# Patient Record
Sex: Female | Born: 1950 | Race: Black or African American | Hispanic: No | Marital: Married
Health system: Southern US, Community
[De-identification: ages and names within clinical notes are randomized; demographics above are authoritative.]

## PROBLEM LIST (undated history)

## (undated) DIAGNOSIS — E0500 Thyrotoxicosis with diffuse goiter without thyrotoxic crisis or storm: Secondary | ICD-10-CM

## (undated) DIAGNOSIS — I10 Essential (primary) hypertension: Secondary | ICD-10-CM

## (undated) HISTORY — PX: TONSILLECTOMY: SUR1361

---

## 2005-01-24 ENCOUNTER — Other Ambulatory Visit: Admission: RE | Admit: 2005-01-24 | Discharge: 2005-01-24 | Payer: Self-pay | Admitting: Obstetrics and Gynecology

## 2007-02-27 ENCOUNTER — Encounter (HOSPITAL_COMMUNITY): Admission: RE | Admit: 2007-02-27 | Discharge: 2007-02-28 | Payer: Self-pay | Admitting: Internal Medicine

## 2011-01-11 ENCOUNTER — Other Ambulatory Visit: Payer: Self-pay | Admitting: Internal Medicine

## 2011-01-11 DIAGNOSIS — E049 Nontoxic goiter, unspecified: Secondary | ICD-10-CM

## 2011-01-15 ENCOUNTER — Other Ambulatory Visit: Payer: Self-pay

## 2011-01-17 ENCOUNTER — Ambulatory Visit
Admission: RE | Admit: 2011-01-17 | Discharge: 2011-01-17 | Disposition: A | Discharge status: Home / Self Care | Payer: BC Managed Care – PPO | Source: Ambulatory Visit | Attending: Internal Medicine | Admitting: Internal Medicine

## 2011-01-17 DIAGNOSIS — E049 Nontoxic goiter, unspecified: Secondary | ICD-10-CM

## 2011-01-24 ENCOUNTER — Other Ambulatory Visit: Payer: Self-pay | Admitting: Endocrinology

## 2011-01-24 DIAGNOSIS — E049 Nontoxic goiter, unspecified: Secondary | ICD-10-CM

## 2011-07-16 ENCOUNTER — Ambulatory Visit
Admission: RE | Admit: 2011-07-16 | Discharge: 2011-07-16 | Disposition: A | Discharge status: Home / Self Care | Payer: BC Managed Care – PPO | Source: Ambulatory Visit | Attending: Endocrinology | Admitting: Endocrinology

## 2011-07-16 DIAGNOSIS — E049 Nontoxic goiter, unspecified: Secondary | ICD-10-CM

## 2013-02-12 ENCOUNTER — Other Ambulatory Visit: Payer: Self-pay | Admitting: Obstetrics and Gynecology

## 2013-02-12 DIAGNOSIS — Z1231 Encounter for screening mammogram for malignant neoplasm of breast: Secondary | ICD-10-CM

## 2013-03-23 ENCOUNTER — Ambulatory Visit
Admission: RE | Admit: 2013-03-23 | Discharge: 2013-03-23 | Disposition: A | Discharge status: Home / Self Care | Payer: BC Managed Care – PPO | Source: Ambulatory Visit | Attending: Obstetrics and Gynecology | Admitting: Obstetrics and Gynecology

## 2013-03-23 DIAGNOSIS — Z1231 Encounter for screening mammogram for malignant neoplasm of breast: Secondary | ICD-10-CM

## 2013-04-17 ENCOUNTER — Other Ambulatory Visit: Payer: Self-pay | Admitting: Endocrinology

## 2013-04-17 DIAGNOSIS — E049 Nontoxic goiter, unspecified: Secondary | ICD-10-CM

## 2013-04-23 ENCOUNTER — Ambulatory Visit
Admission: RE | Admit: 2013-04-23 | Discharge: 2013-04-23 | Disposition: A | Discharge status: Home / Self Care | Payer: BC Managed Care – PPO | Source: Ambulatory Visit | Attending: Endocrinology | Admitting: Endocrinology

## 2013-04-23 DIAGNOSIS — E049 Nontoxic goiter, unspecified: Secondary | ICD-10-CM

## 2013-10-23 ENCOUNTER — Other Ambulatory Visit: Payer: Self-pay | Admitting: Endocrinology

## 2013-10-23 DIAGNOSIS — E049 Nontoxic goiter, unspecified: Secondary | ICD-10-CM

## 2014-04-19 ENCOUNTER — Other Ambulatory Visit: Payer: BC Managed Care – PPO

## 2014-04-28 ENCOUNTER — Encounter (INDEPENDENT_AMBULATORY_CARE_PROVIDER_SITE_OTHER): Payer: Self-pay

## 2014-04-28 ENCOUNTER — Ambulatory Visit
Admission: RE | Admit: 2014-04-28 | Discharge: 2014-04-28 | Disposition: A | Discharge status: Home / Self Care | Payer: BC Managed Care – PPO | Source: Ambulatory Visit | Attending: Endocrinology | Admitting: Endocrinology

## 2014-04-28 DIAGNOSIS — E049 Nontoxic goiter, unspecified: Secondary | ICD-10-CM

## 2015-03-17 IMAGING — US US SOFT TISSUE HEAD/NECK
1 series · 14 of 25 positions shown · non-contrast
Comparison: 07/16/2011

CLINICAL DATA: Goiter

THYROID ULTRASOUND
TECHNIQUE: Ultrasound examination of the thyroid gland and adjacent
soft tissues was performed.

[Series 1: us soft tissue head/neck · 0.06mm/px · 14 of 54 slices shown]
[im 1/54]
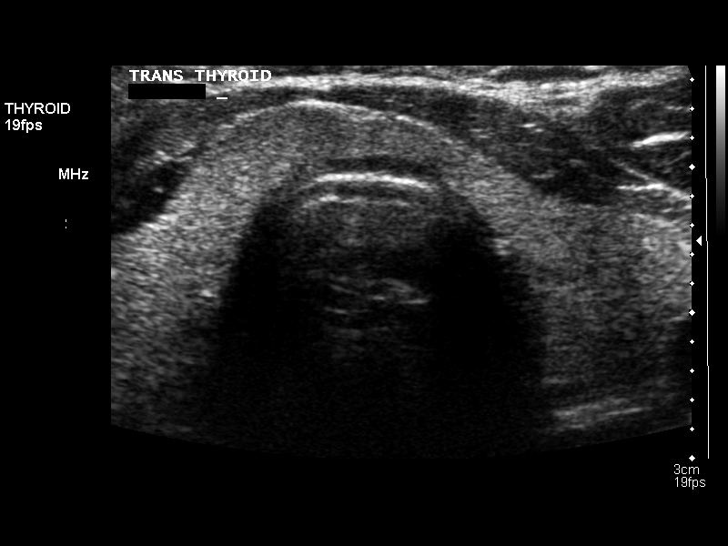
[im 5/54]
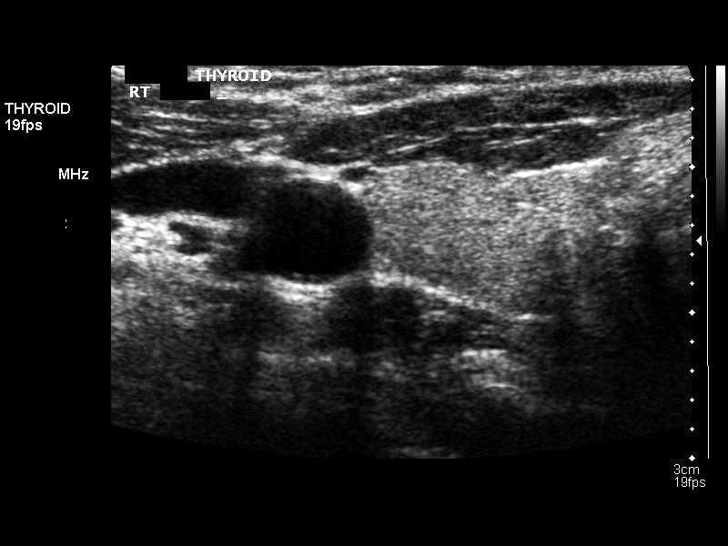
[im 9/54]
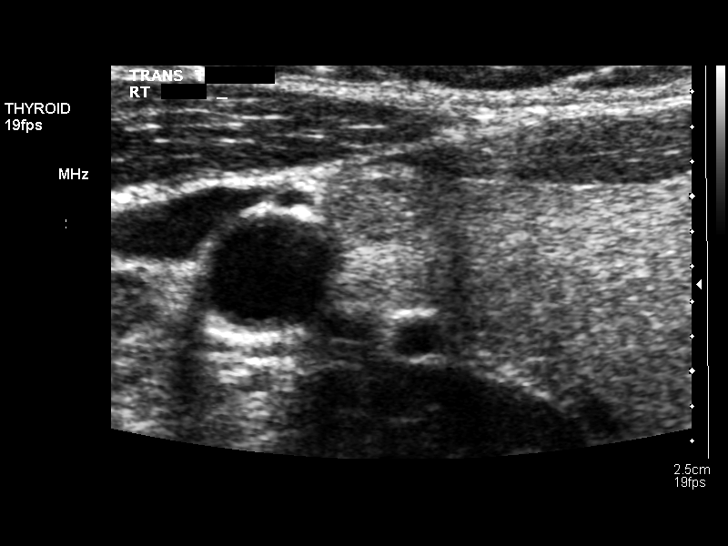
[im 14/54]
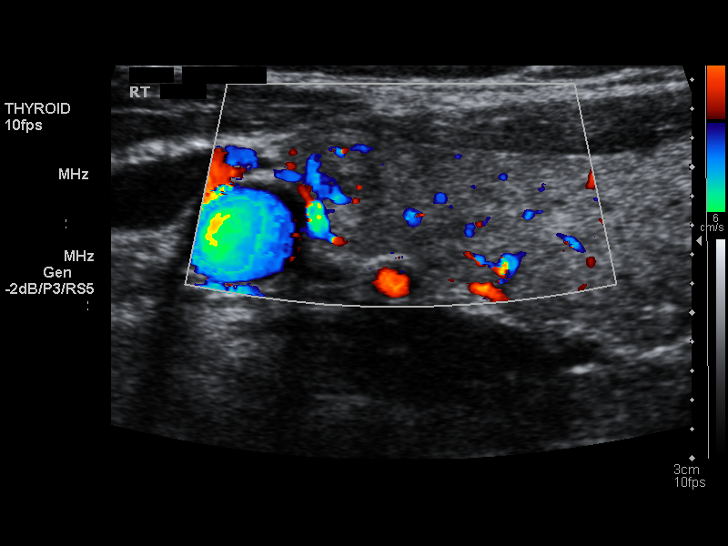
[im 18/54]
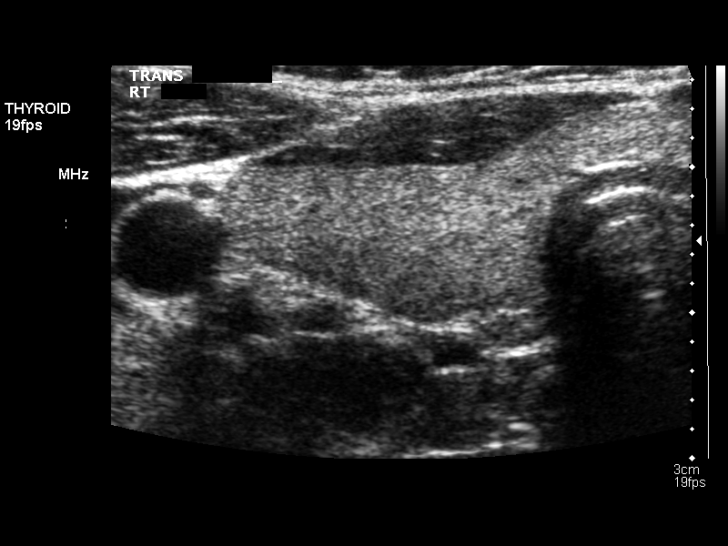
[im 20/54]
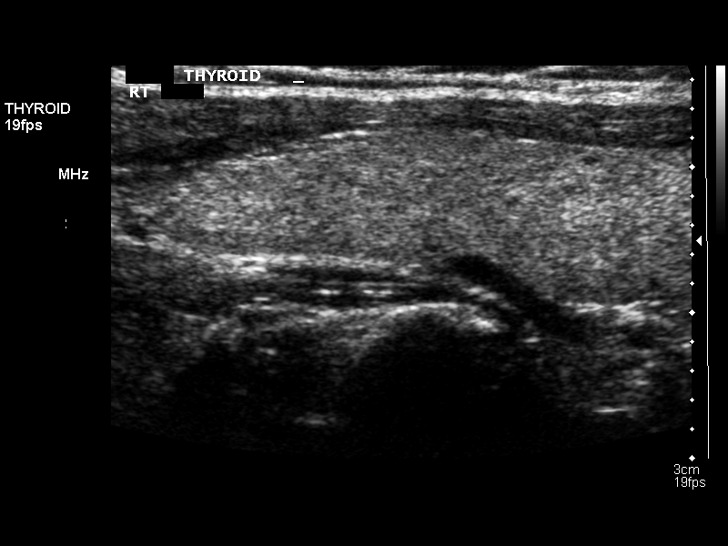
[im 25/54]
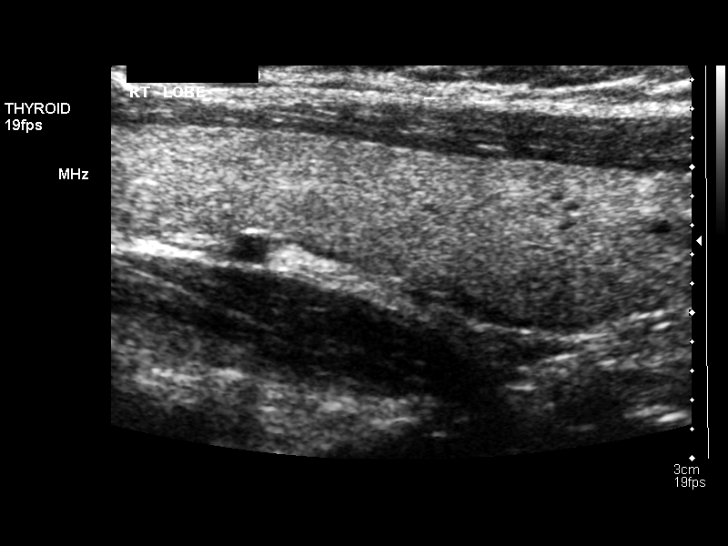
[im 29/54]
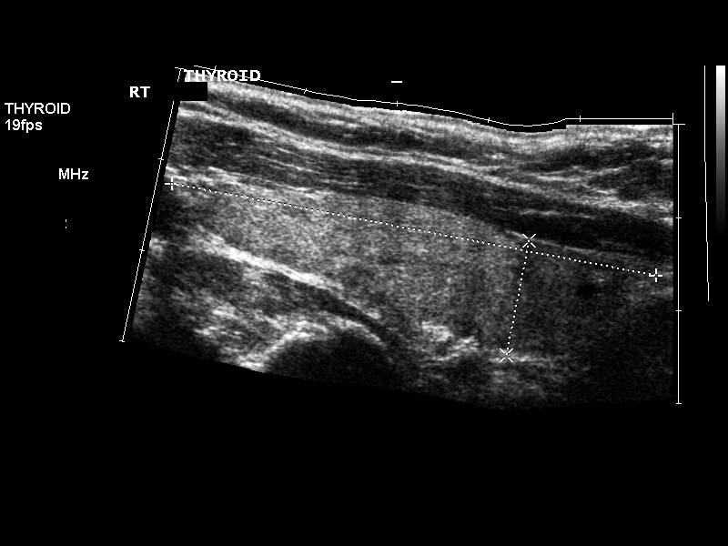
[im 34/54]
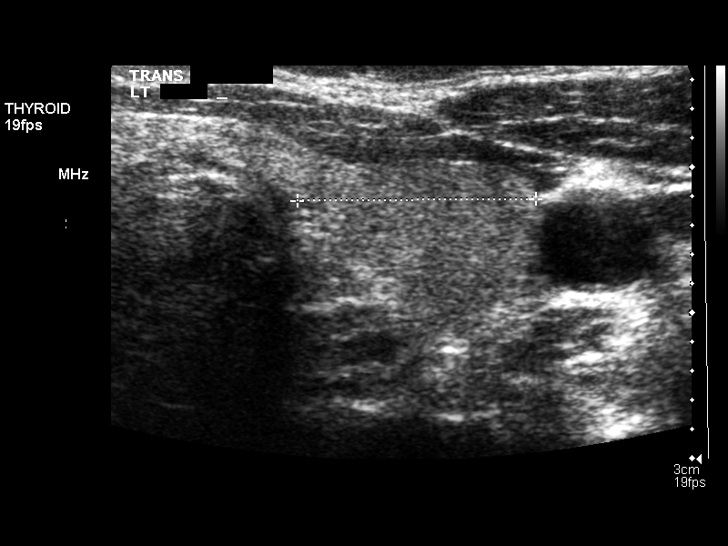
[im 36/54]
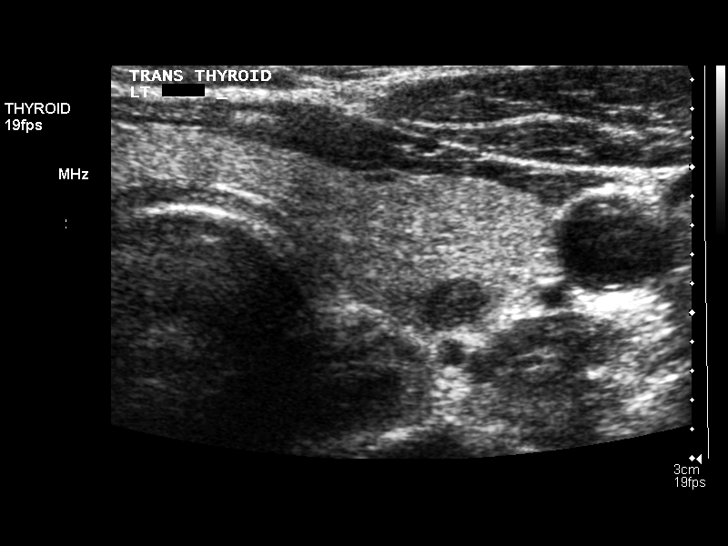
[im 40/54]
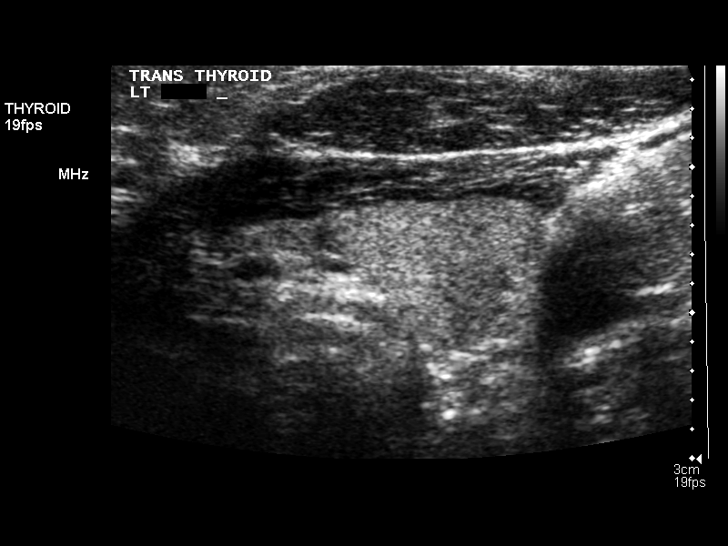
[im 45/54]
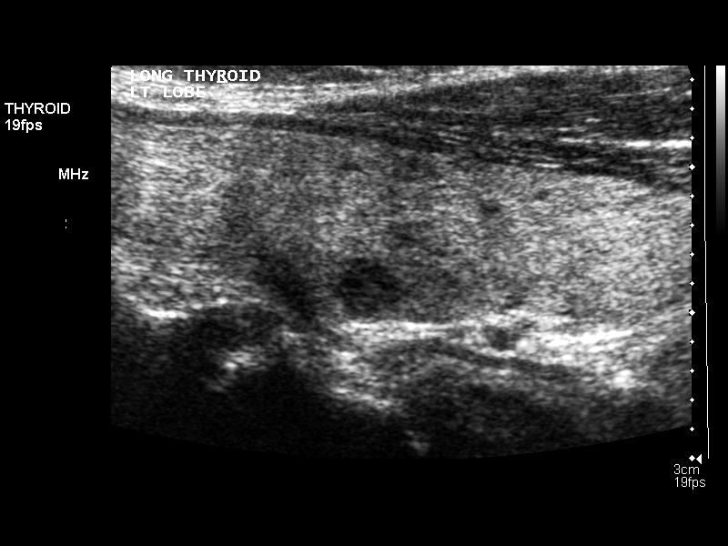
[im 49/54]
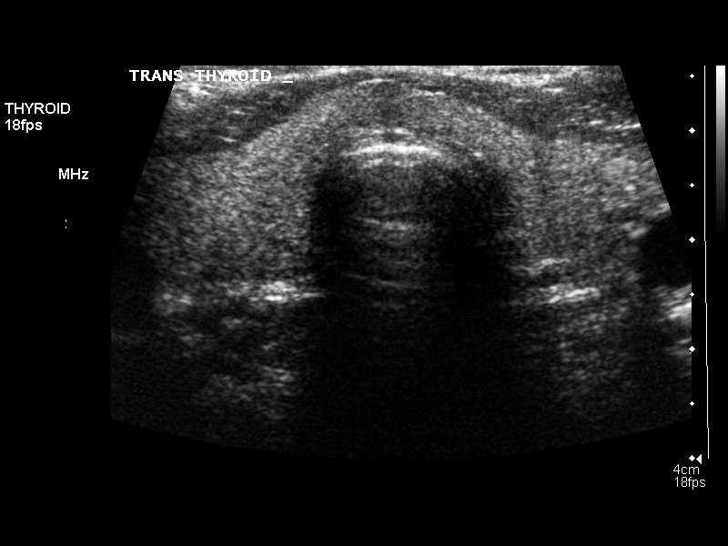
[im 54/54]
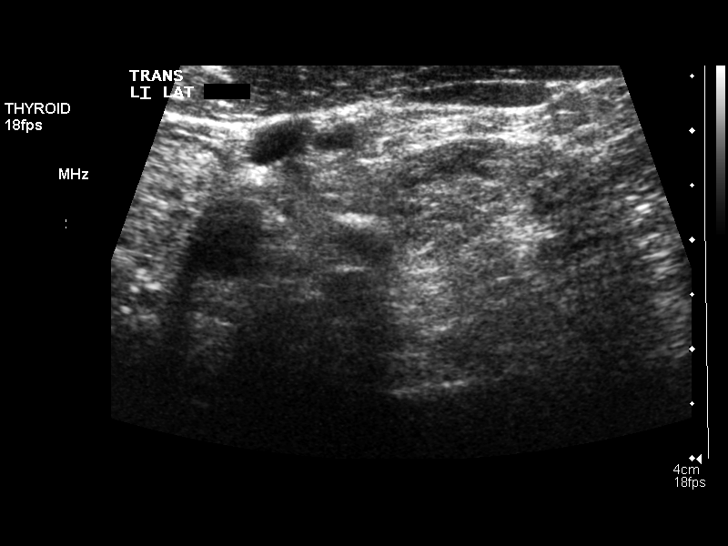

[14 of 25 positions shown; findings below may reference images not displayed]

FINDINGS: Right thyroid lobe:  Right lobe measures 5.3 cm x 1.3 cm x 2.5 cm.
It is homogeneous and echogenicity.
Left thyroid lobe:  Left lobe measures 5.2 cm x 1.4 cm x 1.6 cm.
It is homogeneous in echogenicity.
Isthmus:  The isthmus is slightly thickened measuring 5.3 mm.

Focal nodules:  There are several nodules.  On the right, there is
an isoechoic nodule arising from the anterior mid to upper pole
measuring 6 mm in greatest dimension.  There are too small
hypoechoic nodules in the mid pole of both less than 3 mm in size.

On the left, there is a hypoechoic nodule in the posterior mid pole
measuring 5 mm.  There is a 3 mm mildly hypoechoic nodule above
this also in the mid pole.

Lymphadenopathy:  None visualized.
IMPRESSION: Mildly enlarged thyroid gland.  Small thyroid nodules as detailed.
There has been no significant change from prior study.

## 2015-06-02 ENCOUNTER — Other Ambulatory Visit: Payer: Self-pay | Admitting: Endocrinology

## 2015-06-02 DIAGNOSIS — E049 Nontoxic goiter, unspecified: Secondary | ICD-10-CM

## 2016-03-21 IMAGING — US US SOFT TISSUE HEAD/NECK
1 series · 14 of 25 positions shown · non-contrast
Comparison: 04/23/2013

CLINICAL DATA: Thyroid nodules

EXAM:
THYROID ULTRASOUND
TECHNIQUE: Ultrasound examination of the thyroid gland and adjacent soft
tissues was performed.

[Series 1: us soft tissue head/neck · 0.09mm/px · 14 of 43 slices shown]
[im 1/43]
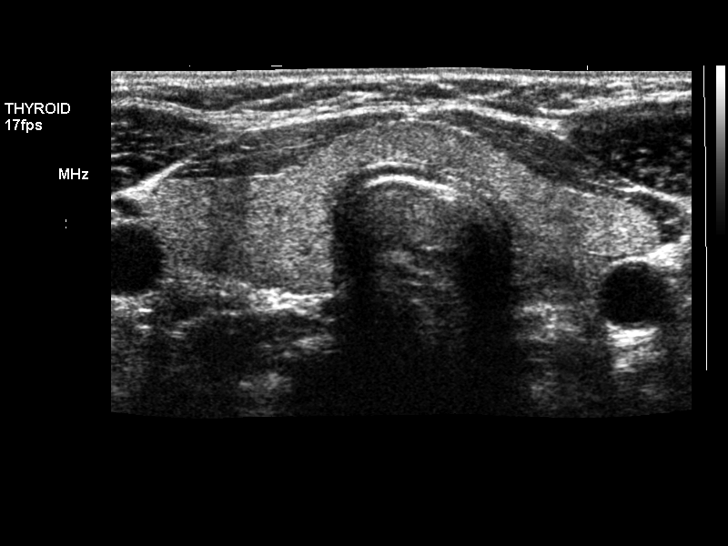
[im 4/43]
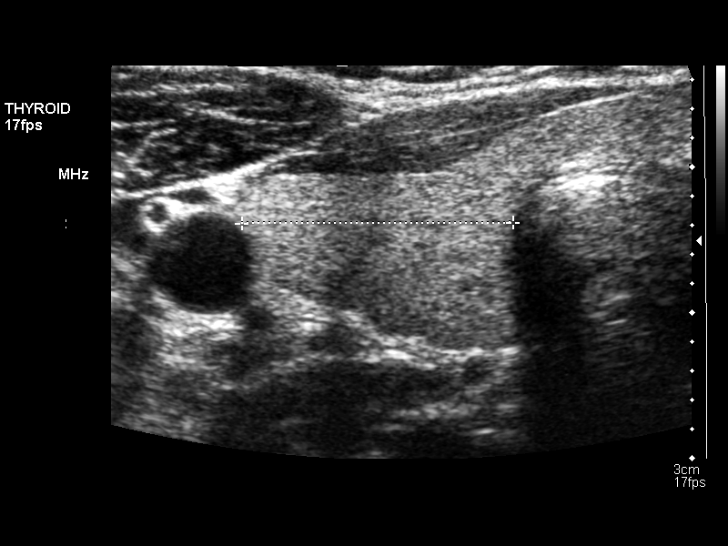
[im 8/43]
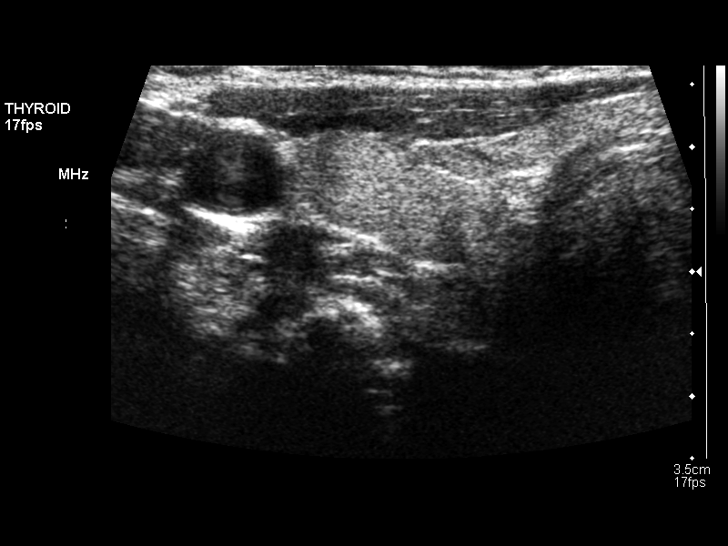
[im 11/43]
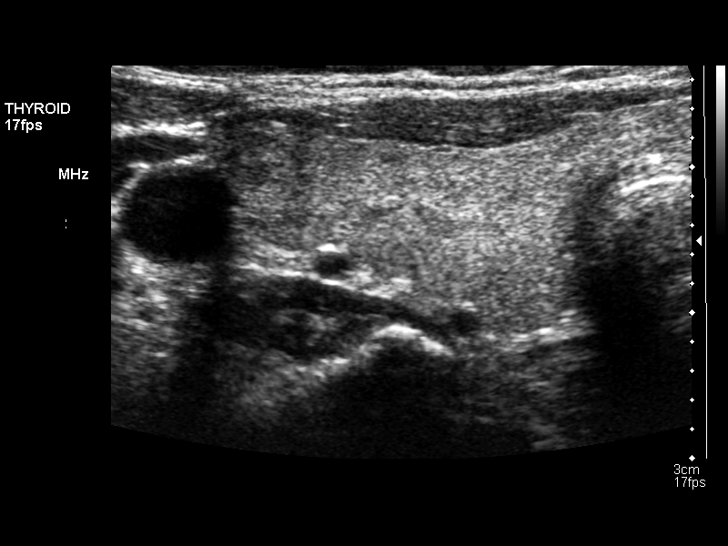
[im 15/43]
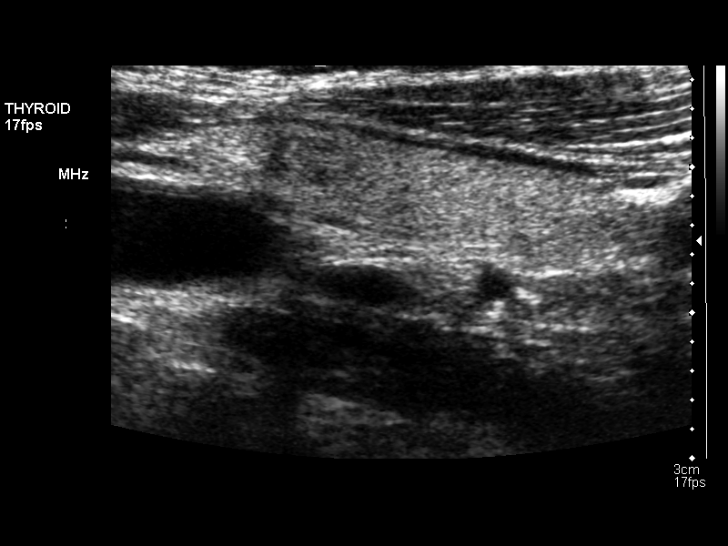
[im 16/43]
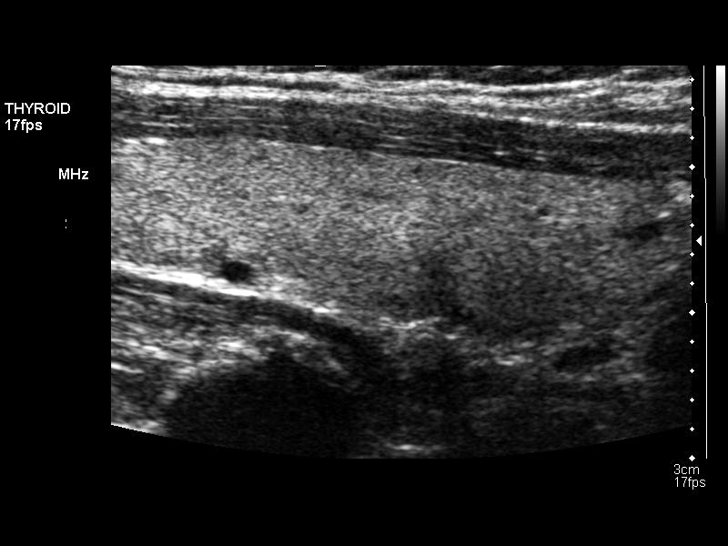
[im 20/43]
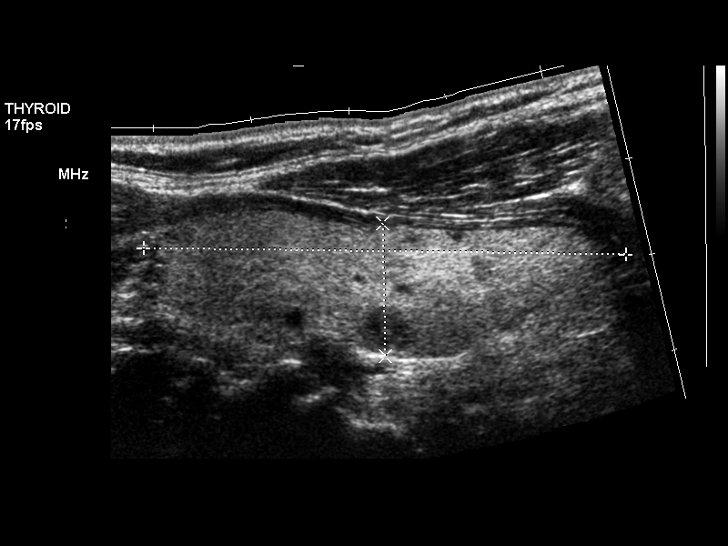
[im 23/43]
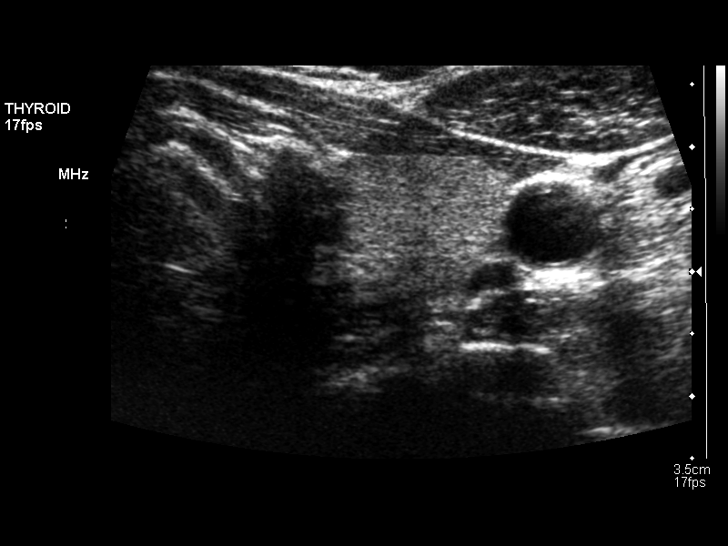
[im 27/43]
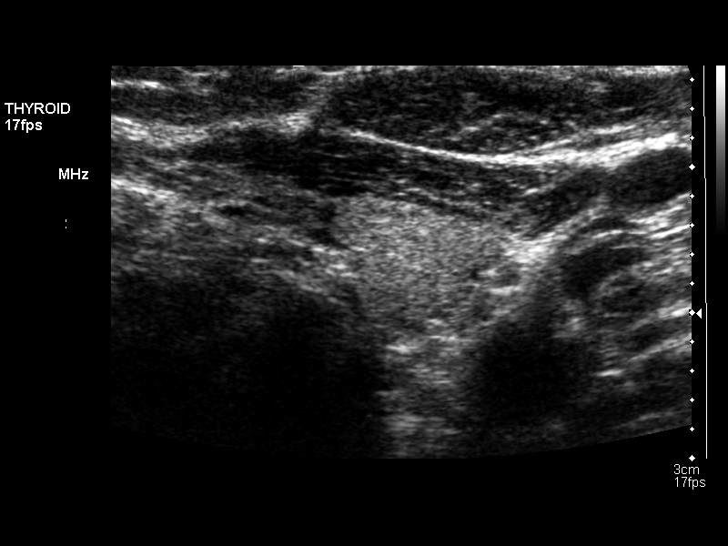
[im 29/43]
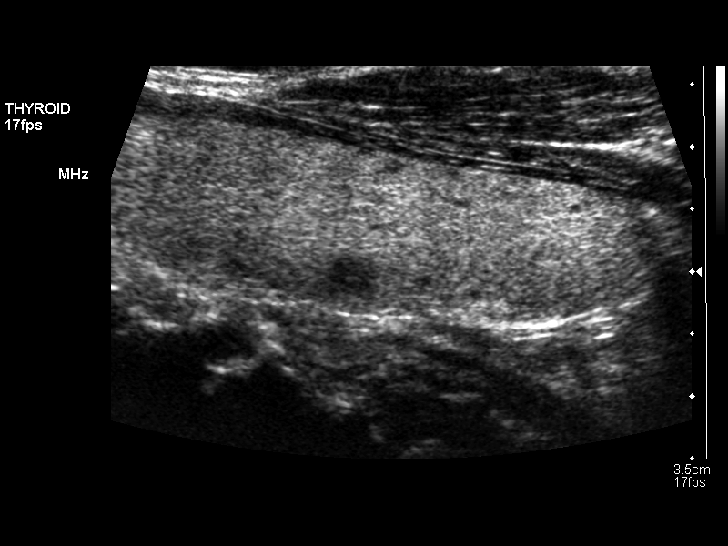
[im 32/43]
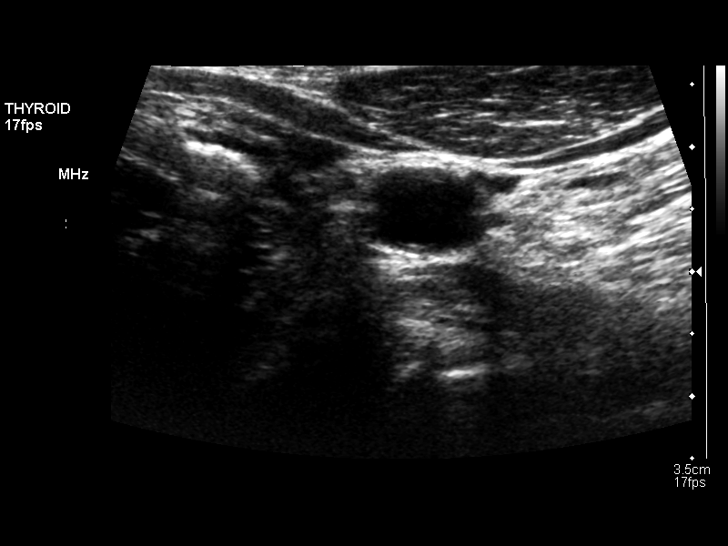
[im 36/43]
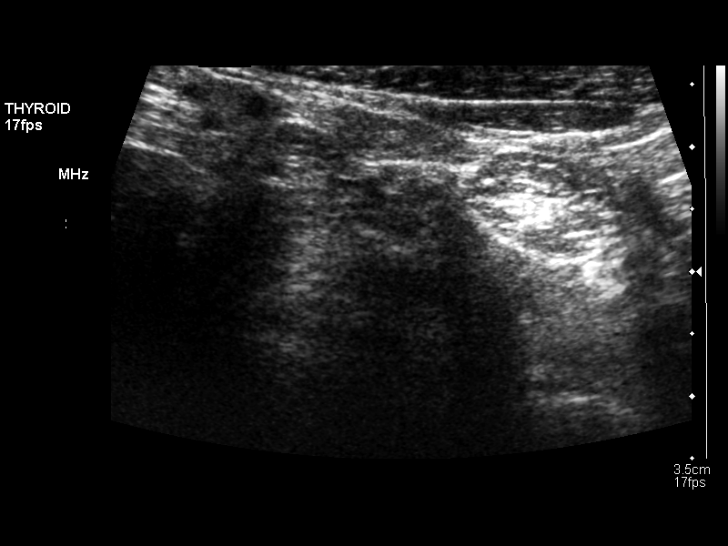
[im 39/43]
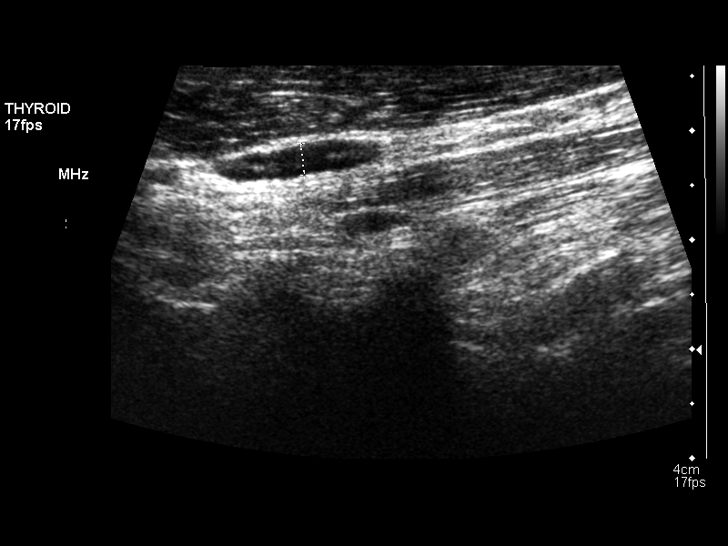
[im 43/43]
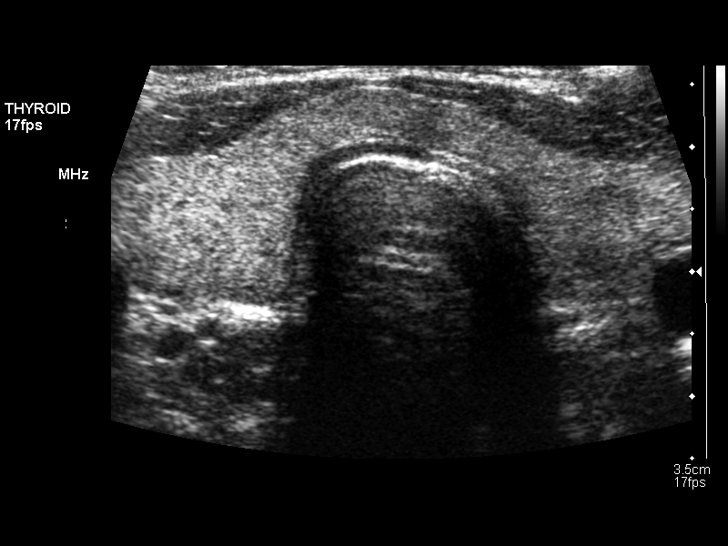

[14 of 25 positions shown; findings below may reference images not displayed]

FINDINGS: Right thyroid lobe

Measurements: 4.8 x 1.5 x 1.9 cm. Stable right thyroid lobe
appearance with a subtle hypoechoic solid nodule measuring 5 mm in
the midpole. No other significant right thyroid abnormality

Left thyroid lobe

Measurements: 4.9 x 1.4 x 1.8 cm. Stable small hypoechoic nodules in
the left thyroid lobe diffusely, largest measures 5 mm in the
midpole. No significant change or other abnormality.

Isthmus

Thickness: 5 mm.  No nodules visualized.

Lymphadenopathy

None visualized.
IMPRESSION: Stable small sub cm thyroid nodules as before. No interval change or
significant abnormality.

## 2016-03-23 DIAGNOSIS — R9431 Abnormal electrocardiogram [ECG] [EKG]: Secondary | ICD-10-CM | POA: Diagnosis not present

## 2016-03-23 DIAGNOSIS — R002 Palpitations: Secondary | ICD-10-CM | POA: Diagnosis not present

## 2016-05-14 DIAGNOSIS — Z01419 Encounter for gynecological examination (general) (routine) without abnormal findings: Secondary | ICD-10-CM | POA: Diagnosis not present

## 2016-05-14 DIAGNOSIS — Z6830 Body mass index (BMI) 30.0-30.9, adult: Secondary | ICD-10-CM | POA: Diagnosis not present

## 2016-05-14 DIAGNOSIS — Z1231 Encounter for screening mammogram for malignant neoplasm of breast: Secondary | ICD-10-CM | POA: Diagnosis not present

## 2016-05-14 DIAGNOSIS — Z124 Encounter for screening for malignant neoplasm of cervix: Secondary | ICD-10-CM | POA: Diagnosis not present

## 2016-05-24 ENCOUNTER — Other Ambulatory Visit: Payer: Self-pay

## 2016-05-24 DIAGNOSIS — E049 Nontoxic goiter, unspecified: Secondary | ICD-10-CM | POA: Diagnosis not present

## 2016-05-24 DIAGNOSIS — E7800 Pure hypercholesterolemia, unspecified: Secondary | ICD-10-CM | POA: Diagnosis not present

## 2016-05-24 DIAGNOSIS — E119 Type 2 diabetes mellitus without complications: Secondary | ICD-10-CM | POA: Diagnosis not present

## 2016-05-31 ENCOUNTER — Other Ambulatory Visit: Payer: Self-pay | Admitting: Endocrinology

## 2016-05-31 DIAGNOSIS — E7800 Pure hypercholesterolemia, unspecified: Secondary | ICD-10-CM | POA: Diagnosis not present

## 2016-05-31 DIAGNOSIS — E049 Nontoxic goiter, unspecified: Secondary | ICD-10-CM | POA: Diagnosis not present

## 2016-05-31 DIAGNOSIS — E119 Type 2 diabetes mellitus without complications: Secondary | ICD-10-CM | POA: Diagnosis not present

## 2016-05-31 DIAGNOSIS — I10 Essential (primary) hypertension: Secondary | ICD-10-CM | POA: Diagnosis not present

## 2016-06-11 ENCOUNTER — Ambulatory Visit
Admission: RE | Admit: 2016-06-11 | Discharge: 2016-06-11 | Disposition: A | Discharge status: Home / Self Care | Payer: BLUE CROSS/BLUE SHIELD | Source: Ambulatory Visit | Attending: Endocrinology | Admitting: Endocrinology

## 2016-06-11 DIAGNOSIS — E042 Nontoxic multinodular goiter: Secondary | ICD-10-CM | POA: Diagnosis not present

## 2016-06-11 DIAGNOSIS — E049 Nontoxic goiter, unspecified: Secondary | ICD-10-CM

## 2016-07-11 DIAGNOSIS — E119 Type 2 diabetes mellitus without complications: Secondary | ICD-10-CM | POA: Diagnosis not present

## 2016-08-02 DIAGNOSIS — E119 Type 2 diabetes mellitus without complications: Secondary | ICD-10-CM | POA: Diagnosis not present

## 2016-08-24 DIAGNOSIS — Z79899 Other long term (current) drug therapy: Secondary | ICD-10-CM | POA: Diagnosis not present

## 2016-08-24 DIAGNOSIS — E559 Vitamin D deficiency, unspecified: Secondary | ICD-10-CM | POA: Diagnosis not present

## 2016-08-24 DIAGNOSIS — Z23 Encounter for immunization: Secondary | ICD-10-CM | POA: Diagnosis not present

## 2016-08-24 DIAGNOSIS — E049 Nontoxic goiter, unspecified: Secondary | ICD-10-CM | POA: Diagnosis not present

## 2016-08-24 DIAGNOSIS — Z0000 Encounter for general adult medical examination without abnormal findings: Secondary | ICD-10-CM | POA: Diagnosis not present

## 2016-08-24 DIAGNOSIS — I10 Essential (primary) hypertension: Secondary | ICD-10-CM | POA: Diagnosis not present

## 2016-08-24 DIAGNOSIS — E119 Type 2 diabetes mellitus without complications: Secondary | ICD-10-CM | POA: Diagnosis not present

## 2016-08-24 DIAGNOSIS — Z1159 Encounter for screening for other viral diseases: Secondary | ICD-10-CM | POA: Diagnosis not present

## 2017-02-27 DIAGNOSIS — E119 Type 2 diabetes mellitus without complications: Secondary | ICD-10-CM | POA: Diagnosis not present

## 2017-09-19 DIAGNOSIS — M1712 Unilateral primary osteoarthritis, left knee: Secondary | ICD-10-CM | POA: Diagnosis not present

## 2017-09-19 DIAGNOSIS — M25561 Pain in right knee: Secondary | ICD-10-CM | POA: Diagnosis not present

## 2017-09-19 DIAGNOSIS — M1711 Unilateral primary osteoarthritis, right knee: Secondary | ICD-10-CM | POA: Diagnosis not present

## 2017-09-19 DIAGNOSIS — M25562 Pain in left knee: Secondary | ICD-10-CM | POA: Diagnosis not present

## 2017-09-30 DIAGNOSIS — Z79899 Other long term (current) drug therapy: Secondary | ICD-10-CM | POA: Diagnosis not present

## 2017-09-30 DIAGNOSIS — E559 Vitamin D deficiency, unspecified: Secondary | ICD-10-CM | POA: Diagnosis not present

## 2017-09-30 DIAGNOSIS — E049 Nontoxic goiter, unspecified: Secondary | ICD-10-CM | POA: Diagnosis not present

## 2017-09-30 DIAGNOSIS — E119 Type 2 diabetes mellitus without complications: Secondary | ICD-10-CM | POA: Diagnosis not present

## 2017-09-30 DIAGNOSIS — Z0000 Encounter for general adult medical examination without abnormal findings: Secondary | ICD-10-CM | POA: Diagnosis not present

## 2018-05-05 IMAGING — US US THYROID
1 series · 13 of 25 positions shown · non-contrast
Comparison: None.

CLINICAL DATA: Goiter.  Follow-up thyroid nodules.

EXAM:
THYROID ULTRASOUND
TECHNIQUE: Ultrasound examination of the thyroid gland and adjacent soft
tissues was performed.

[Series 1: us thyroid · 0.06mm/px · 13 of 52 slices shown]
[im 1/52]
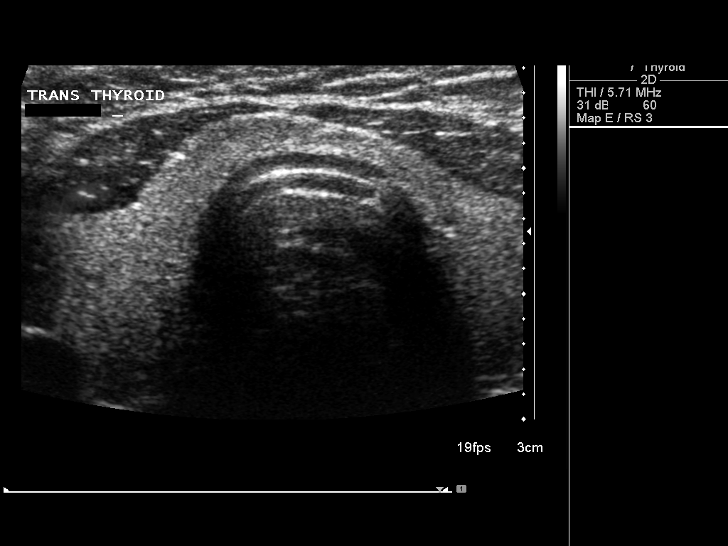
[im 5/52]
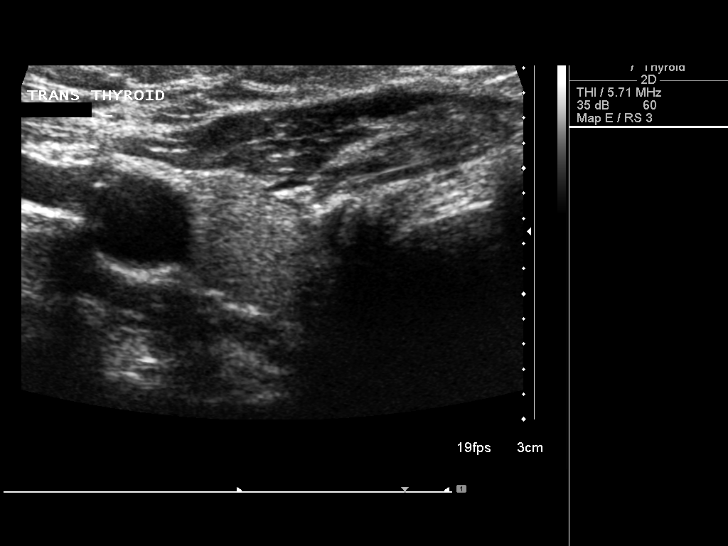
[im 9/52]
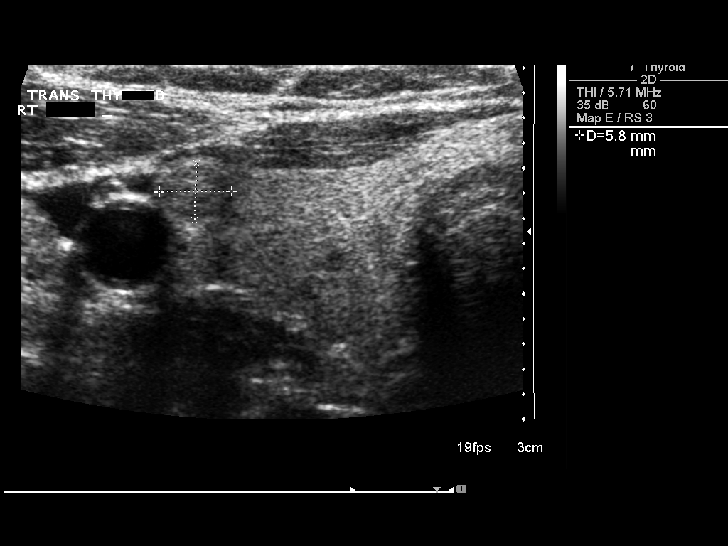
[im 13/52]
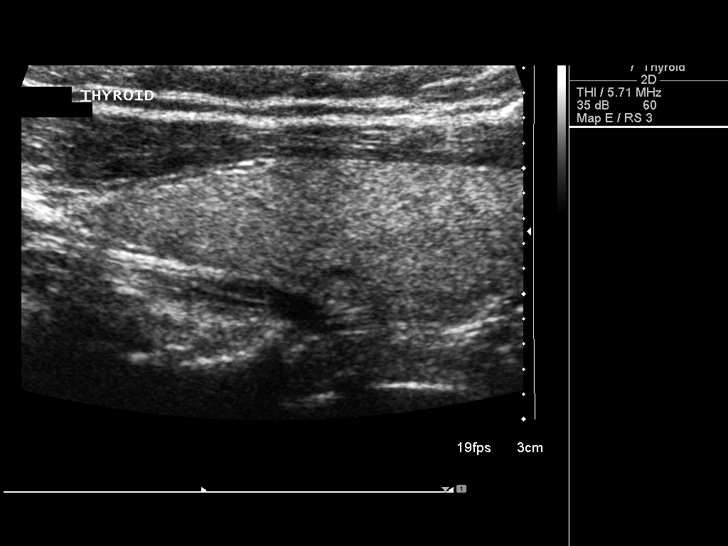
[im 18/52]
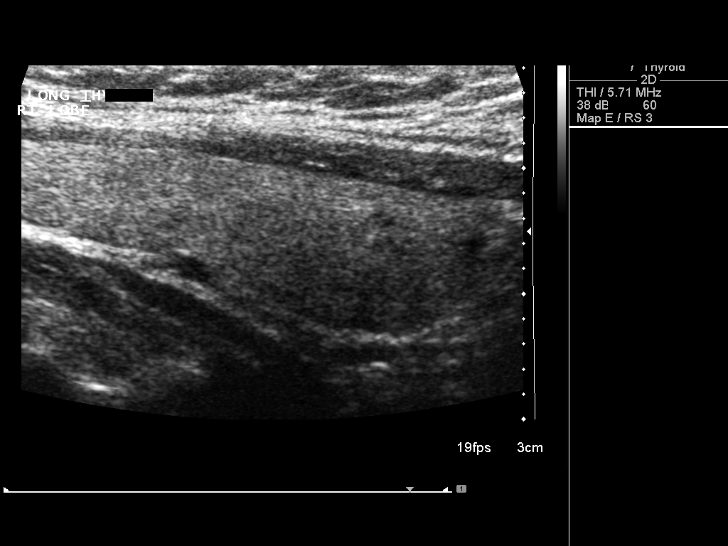
[im 22/52]
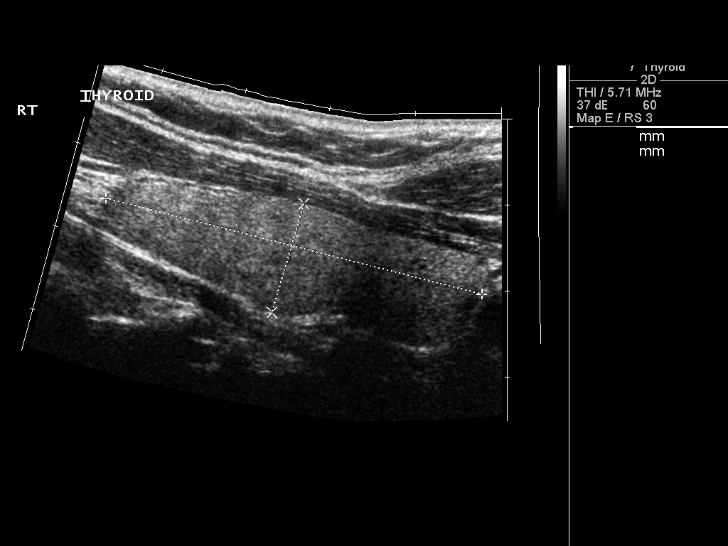
[im 26/52]
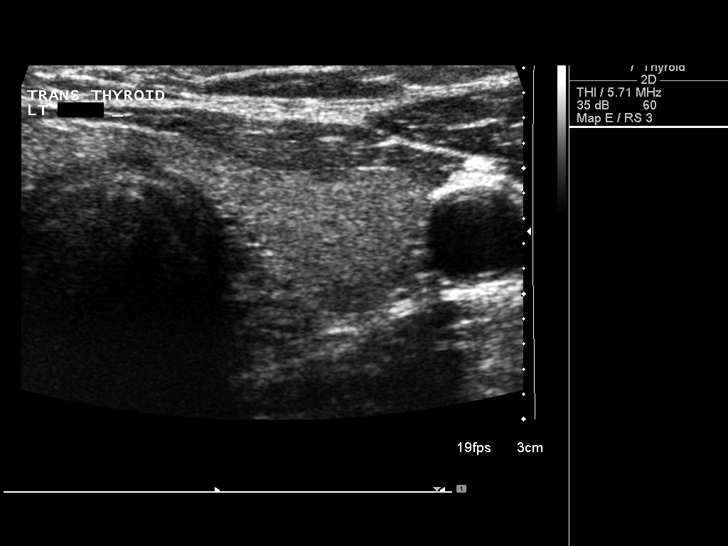
[im 30/52]
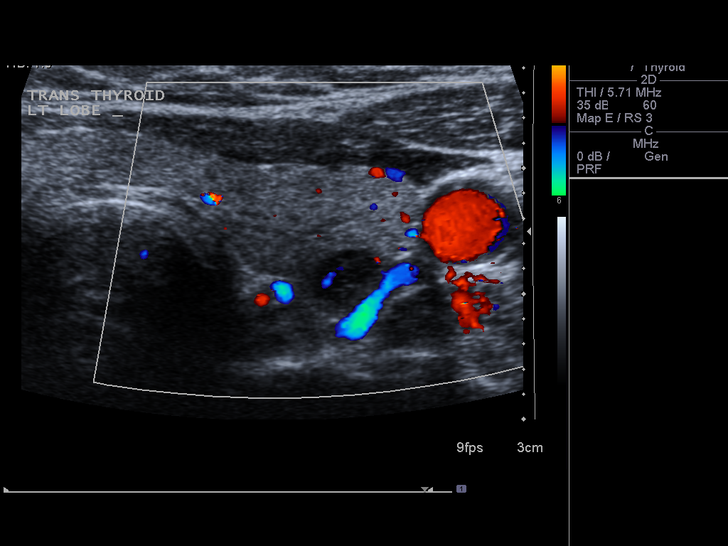
[im 35/52]
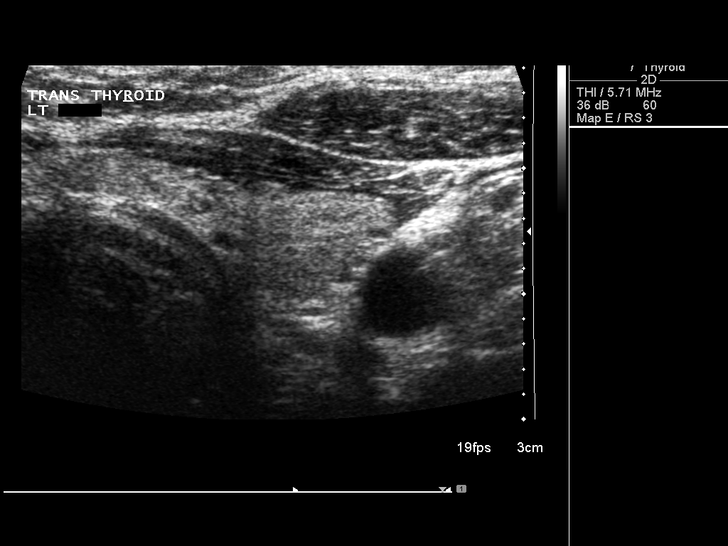
[im 39/52]
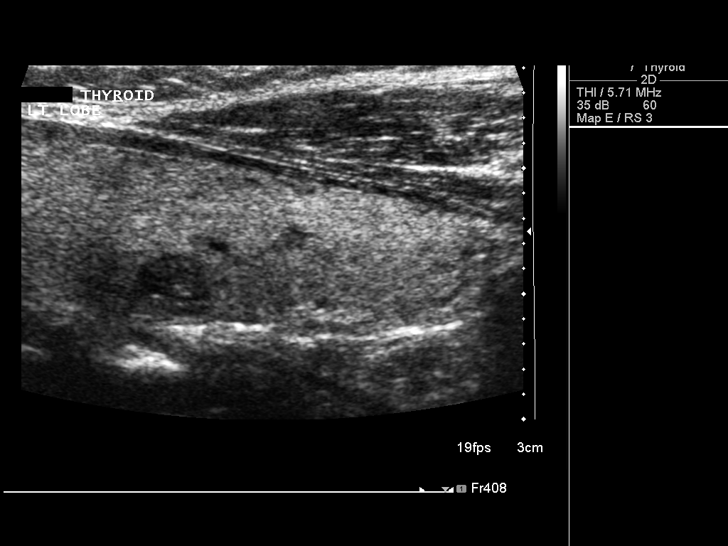
[im 43/52]
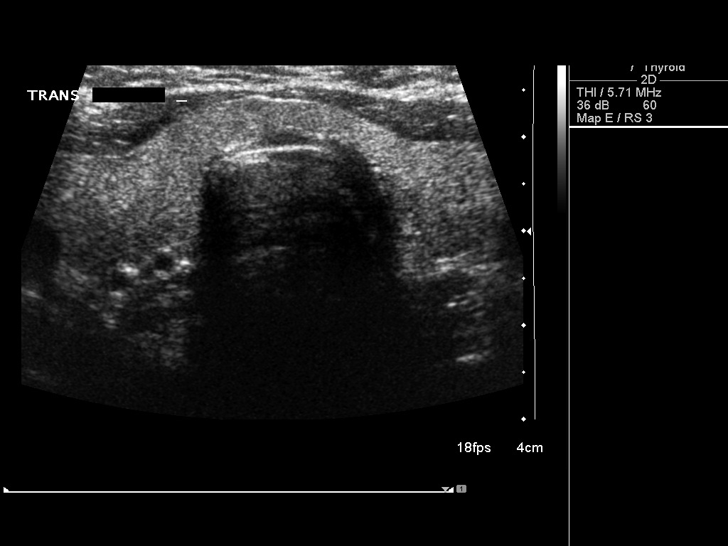
[im 47/52]
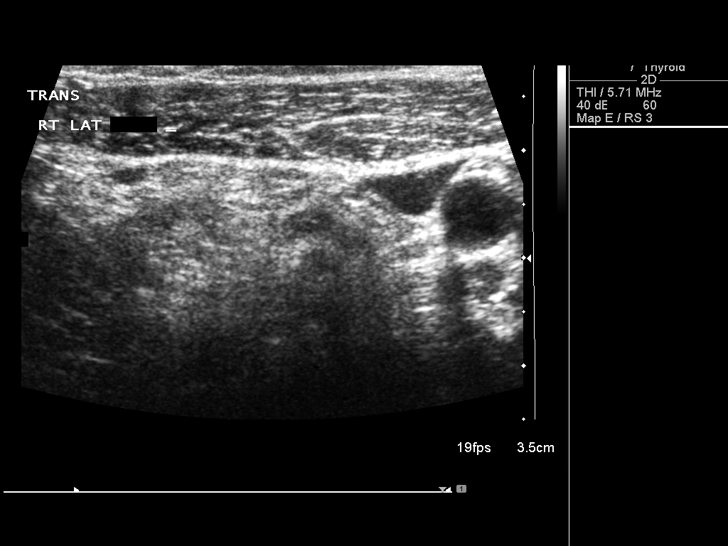
[im 52/52]
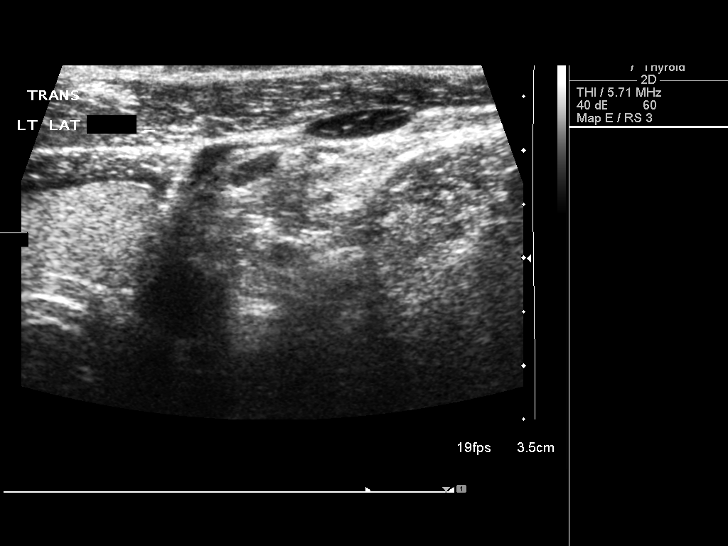

[13 of 25 positions shown; findings below may reference images not displayed]

FINDINGS: Parenchymal Echotexture: Normal

Estimated total number of nodules >/= 1 cm: 0

Number of spongiform nodules >/=  2 cm not described below (TR1): 0

Number of mixed cystic and solid nodules >/= 1.5 cm not described
below (TR2): 0

_________________________________________________________

Isthmus: Measures 0.5 cm thickness.

No discrete nodules are identified within the thyroid isthmus.

_________________________________________________________

Right lobe: Measures 4.5 x 1.3 x 2.0 cm.

Nodule # 1:

Location: Right; Mid

Size: Measures 0.7 x 0.5 x 0.6 cm and previously measured 0.5 cm in
the transverse dimension.

Composition: solid/almost completely solid (2)

Echogenicity: isoechoic (1)

Shape: not taller-than-wide (0)

Margins: ill-defined (0)

Echogenic foci: none (0)

ACR TI-RADS total points: 3.

ACR TI-RADS risk category: TR3 (3 points).

ACR TI-RADS recommendations:

Given size (<1.4 cm) and appearance, this nodule does NOT meet
TI-RADS criteria for biopsy or dedicated follow-up.

_________________________________________________________

Left lobe: Measures 4.9 x 1.4 x 1.6 cm

Nodule # 2:

Location: Left; Mid

Size: Measures 0.6 x 0.5 x 0.5 cm and previously measured 0.5 cm in
transverse dimension.

Composition: solid/almost completely solid (2)

Echogenicity: hypoechoic (2)

Shape: not taller-than-wide (0)

Margins: ill-defined (0)

Echogenic foci: punctate echogenic foci (3). Questionable echogenic
foci. Findings are similar to the previous examination.

ACR TI-RADS total points: 7.

ACR TI-RADS risk category: TR5 (>/= 7 points).

ACR TI-RADS recommendations:

*Given size (>/= 0.5 - 0.9 cm) and appearance, a follow-up
ultrasound in 1 year should be considered based on TI-RADS criteria.
IMPRESSION: Minimal change in the bilateral thyroid nodules. The hypoechoic
nodule in left thyroid lobe meets criteria for 1 year follow-up.

The above is in keeping with the ACR TI-RADS recommendations - [HOSPITAL] 2189;[DATE].

## 2018-06-11 DIAGNOSIS — Z23 Encounter for immunization: Secondary | ICD-10-CM | POA: Diagnosis not present

## 2018-07-28 DIAGNOSIS — E119 Type 2 diabetes mellitus without complications: Secondary | ICD-10-CM | POA: Diagnosis not present

## 2018-09-10 DIAGNOSIS — J069 Acute upper respiratory infection, unspecified: Secondary | ICD-10-CM | POA: Diagnosis not present

## 2018-09-10 DIAGNOSIS — J029 Acute pharyngitis, unspecified: Secondary | ICD-10-CM | POA: Diagnosis not present

## 2018-11-10 DIAGNOSIS — Z0000 Encounter for general adult medical examination without abnormal findings: Secondary | ICD-10-CM | POA: Diagnosis not present

## 2018-11-10 DIAGNOSIS — Z79899 Other long term (current) drug therapy: Secondary | ICD-10-CM | POA: Diagnosis not present

## 2018-11-10 DIAGNOSIS — E1169 Type 2 diabetes mellitus with other specified complication: Secondary | ICD-10-CM | POA: Diagnosis not present

## 2018-11-10 DIAGNOSIS — I10 Essential (primary) hypertension: Secondary | ICD-10-CM | POA: Diagnosis not present

## 2018-11-10 DIAGNOSIS — E049 Nontoxic goiter, unspecified: Secondary | ICD-10-CM | POA: Diagnosis not present

## 2018-11-10 DIAGNOSIS — E559 Vitamin D deficiency, unspecified: Secondary | ICD-10-CM | POA: Diagnosis not present

## 2018-11-10 DIAGNOSIS — Z23 Encounter for immunization: Secondary | ICD-10-CM | POA: Diagnosis not present

## 2018-12-11 DIAGNOSIS — Z23 Encounter for immunization: Secondary | ICD-10-CM | POA: Diagnosis not present

## 2019-03-13 DIAGNOSIS — Z23 Encounter for immunization: Secondary | ICD-10-CM | POA: Diagnosis not present

## 2019-04-27 DIAGNOSIS — E7800 Pure hypercholesterolemia, unspecified: Secondary | ICD-10-CM | POA: Diagnosis not present

## 2019-04-27 DIAGNOSIS — E049 Nontoxic goiter, unspecified: Secondary | ICD-10-CM | POA: Diagnosis not present

## 2019-04-27 DIAGNOSIS — I10 Essential (primary) hypertension: Secondary | ICD-10-CM | POA: Diagnosis not present

## 2019-04-27 DIAGNOSIS — E119 Type 2 diabetes mellitus without complications: Secondary | ICD-10-CM | POA: Diagnosis not present

## 2019-05-01 ENCOUNTER — Other Ambulatory Visit: Payer: Self-pay | Admitting: Endocrinology

## 2019-05-01 DIAGNOSIS — E049 Nontoxic goiter, unspecified: Secondary | ICD-10-CM

## 2019-05-20 ENCOUNTER — Ambulatory Visit
Admission: RE | Admit: 2019-05-20 | Discharge: 2019-05-20 | Disposition: A | Discharge status: Home / Self Care | Payer: BC Managed Care – PPO | Source: Ambulatory Visit | Attending: Endocrinology | Admitting: Endocrinology

## 2019-05-20 DIAGNOSIS — E042 Nontoxic multinodular goiter: Secondary | ICD-10-CM | POA: Diagnosis not present

## 2019-05-20 DIAGNOSIS — E049 Nontoxic goiter, unspecified: Secondary | ICD-10-CM

## 2019-09-27 ENCOUNTER — Ambulatory Visit: Payer: Self-pay | Attending: Internal Medicine

## 2019-09-27 DIAGNOSIS — Z23 Encounter for immunization: Secondary | ICD-10-CM | POA: Insufficient documentation

## 2019-09-28 NOTE — Progress Notes (Signed)
   Covid-19 Vaccination Clinic  Name:  MORAYO LEVEN    MRN: [id number] DOB: 08/08/1951  09/27/2019  Ms. Shirk was observed post Covid-19 immunization for 15 minutes without incidence. She was provided with Vaccine Information Sheet and instruction to access the V-Safe system.   Ms. Sianez was instructed to call 911 with any severe reactions post vaccine: Marland Kitchen Difficulty breathing  . Swelling of your face and throat  . A fast heartbeat  . A bad rash all over your body  . Dizziness and weakness    Immunizations Administered    Name Date Dose VIS Date Route   Moderna COVID-19 Vaccine 09/27/2019  3:55 PM 0.5 mL 08/04/2019 Intramuscular   Manufacturer: Gala Murdoch   Lot: [id number]   NDC: 61969-409-82      Documented on behalf of: Shan Levans, MD

## 2019-10-02 ENCOUNTER — Ambulatory Visit: Payer: BC Managed Care – PPO

## 2019-10-25 ENCOUNTER — Ambulatory Visit: Payer: Self-pay | Attending: Internal Medicine

## 2019-10-25 DIAGNOSIS — Z23 Encounter for immunization: Secondary | ICD-10-CM | POA: Insufficient documentation

## 2019-10-25 NOTE — Progress Notes (Signed)
   Covid-19 Vaccination Clinic  Name:  Jillian Farley    MRN: [id number] DOB: Jan 16, 1951  10/25/2019  [name] was observed post Covid-19 immunization for 15 minutes without incidence. She was provided with Vaccine Information Sheet and instruction to access the V-Safe system.   [name] was instructed to call 911 with any severe reactions post vaccine: Marland Kitchen Difficulty breathing  . Swelling of your face and throat  . A fast heartbeat  . A bad rash all over your body  . Dizziness and weakness    Immunizations Administered    Name Date Dose VIS Date Route   Moderna COVID-19 Vaccine 10/25/2019  3:46 PM 0.5 mL 08/04/2019 Intramuscular   Manufacturer: Moderna   Lot: [id number]   NDC: 71580-638-68

## 2019-12-23 DIAGNOSIS — E119 Type 2 diabetes mellitus without complications: Secondary | ICD-10-CM | POA: Diagnosis not present

## 2019-12-30 DIAGNOSIS — E1169 Type 2 diabetes mellitus with other specified complication: Secondary | ICD-10-CM | POA: Diagnosis not present

## 2019-12-30 DIAGNOSIS — Z79899 Other long term (current) drug therapy: Secondary | ICD-10-CM | POA: Diagnosis not present

## 2019-12-30 DIAGNOSIS — E559 Vitamin D deficiency, unspecified: Secondary | ICD-10-CM | POA: Diagnosis not present

## 2019-12-30 DIAGNOSIS — E049 Nontoxic goiter, unspecified: Secondary | ICD-10-CM | POA: Diagnosis not present

## 2019-12-30 DIAGNOSIS — Z0000 Encounter for general adult medical examination without abnormal findings: Secondary | ICD-10-CM | POA: Diagnosis not present

## 2019-12-30 DIAGNOSIS — I10 Essential (primary) hypertension: Secondary | ICD-10-CM | POA: Diagnosis not present

## 2020-01-01 DIAGNOSIS — E7800 Pure hypercholesterolemia, unspecified: Secondary | ICD-10-CM

## 2020-01-01 HISTORY — DX: Pure hypercholesterolemia, unspecified: E78.00

## 2020-04-18 DIAGNOSIS — E119 Type 2 diabetes mellitus without complications: Secondary | ICD-10-CM | POA: Diagnosis not present

## 2020-04-18 DIAGNOSIS — E7800 Pure hypercholesterolemia, unspecified: Secondary | ICD-10-CM | POA: Diagnosis not present

## 2020-04-18 DIAGNOSIS — E049 Nontoxic goiter, unspecified: Secondary | ICD-10-CM | POA: Diagnosis not present

## 2020-04-25 DIAGNOSIS — I10 Essential (primary) hypertension: Secondary | ICD-10-CM | POA: Diagnosis not present

## 2020-04-25 DIAGNOSIS — E7800 Pure hypercholesterolemia, unspecified: Secondary | ICD-10-CM | POA: Diagnosis not present

## 2020-04-25 DIAGNOSIS — E049 Nontoxic goiter, unspecified: Secondary | ICD-10-CM | POA: Diagnosis not present

## 2020-04-25 DIAGNOSIS — E119 Type 2 diabetes mellitus without complications: Secondary | ICD-10-CM | POA: Diagnosis not present

## 2020-11-08 DIAGNOSIS — E1142 Type 2 diabetes mellitus with diabetic polyneuropathy: Secondary | ICD-10-CM | POA: Diagnosis not present

## 2020-11-08 DIAGNOSIS — R202 Paresthesia of skin: Secondary | ICD-10-CM | POA: Diagnosis not present

## 2020-11-08 DIAGNOSIS — E119 Type 2 diabetes mellitus without complications: Secondary | ICD-10-CM | POA: Diagnosis not present

## 2020-11-08 DIAGNOSIS — Z7984 Long term (current) use of oral hypoglycemic drugs: Secondary | ICD-10-CM | POA: Diagnosis not present

## 2020-11-08 DIAGNOSIS — F4321 Adjustment disorder with depressed mood: Secondary | ICD-10-CM | POA: Diagnosis not present

## 2020-11-24 ENCOUNTER — Other Ambulatory Visit (HOSPITAL_COMMUNITY): Payer: Self-pay | Admitting: Family Medicine

## 2020-11-24 DIAGNOSIS — R011 Cardiac murmur, unspecified: Secondary | ICD-10-CM

## 2020-12-09 DIAGNOSIS — G5601 Carpal tunnel syndrome, right upper limb: Secondary | ICD-10-CM | POA: Diagnosis not present

## 2020-12-09 DIAGNOSIS — M79601 Pain in right arm: Secondary | ICD-10-CM | POA: Diagnosis not present

## 2020-12-09 DIAGNOSIS — E611 Iron deficiency: Secondary | ICD-10-CM | POA: Diagnosis not present

## 2020-12-15 DIAGNOSIS — Z124 Encounter for screening for malignant neoplasm of cervix: Secondary | ICD-10-CM | POA: Diagnosis not present

## 2020-12-15 DIAGNOSIS — Z1231 Encounter for screening mammogram for malignant neoplasm of breast: Secondary | ICD-10-CM | POA: Diagnosis not present

## 2020-12-15 DIAGNOSIS — Z6830 Body mass index (BMI) 30.0-30.9, adult: Secondary | ICD-10-CM | POA: Diagnosis not present

## 2020-12-15 DIAGNOSIS — Z01419 Encounter for gynecological examination (general) (routine) without abnormal findings: Secondary | ICD-10-CM | POA: Diagnosis not present

## 2020-12-23 ENCOUNTER — Ambulatory Visit (HOSPITAL_COMMUNITY): Payer: BC Managed Care – PPO | Attending: Internal Medicine

## 2020-12-23 ENCOUNTER — Other Ambulatory Visit: Payer: Self-pay

## 2020-12-23 DIAGNOSIS — R011 Cardiac murmur, unspecified: Secondary | ICD-10-CM | POA: Insufficient documentation

## 2020-12-23 LAB — ECHOCARDIOGRAM COMPLETE
Area-P 1/2: 4.49 cm2
S' Lateral: 2.5 cm

## 2021-01-10 DIAGNOSIS — E119 Type 2 diabetes mellitus without complications: Secondary | ICD-10-CM | POA: Diagnosis not present

## 2021-01-10 DIAGNOSIS — E538 Deficiency of other specified B group vitamins: Secondary | ICD-10-CM | POA: Diagnosis not present

## 2021-01-10 DIAGNOSIS — E611 Iron deficiency: Secondary | ICD-10-CM | POA: Diagnosis not present

## 2021-01-10 DIAGNOSIS — Z23 Encounter for immunization: Secondary | ICD-10-CM | POA: Diagnosis not present

## 2021-01-10 DIAGNOSIS — E785 Hyperlipidemia, unspecified: Secondary | ICD-10-CM | POA: Diagnosis not present

## 2021-01-10 DIAGNOSIS — E559 Vitamin D deficiency, unspecified: Secondary | ICD-10-CM | POA: Diagnosis not present

## 2021-01-10 DIAGNOSIS — I10 Essential (primary) hypertension: Secondary | ICD-10-CM | POA: Diagnosis not present

## 2021-01-10 DIAGNOSIS — Z0000 Encounter for general adult medical examination without abnormal findings: Secondary | ICD-10-CM | POA: Diagnosis not present

## 2021-04-12 IMAGING — US US THYROID
1 series · 13 of 25 positions shown · non-contrast
Comparison: 06/11/2016 , 04/28/2014, and previous

CLINICAL DATA: Nontoxic goiter

EXAM:
THYROID ULTRASOUND
TECHNIQUE: Ultrasound examination of the thyroid gland and adjacent soft
tissues was performed.

[Series 1: us thyroid · 0.08mm/px · 13 of 44 slices shown]
[im 1/44]
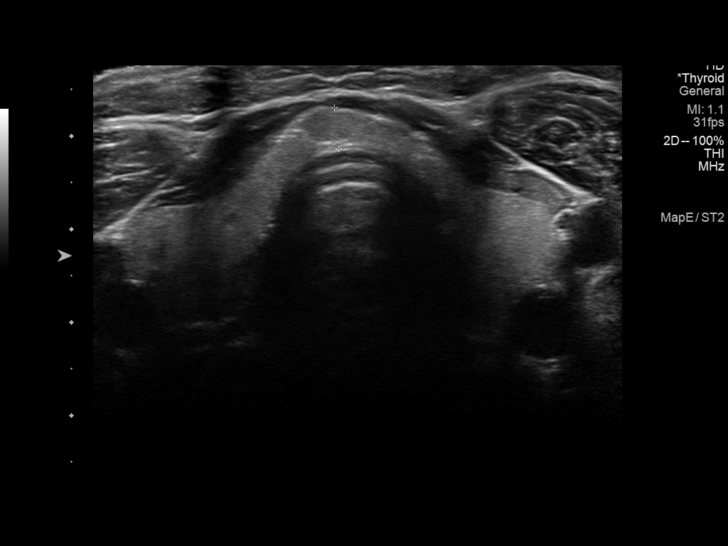
[im 4/44]
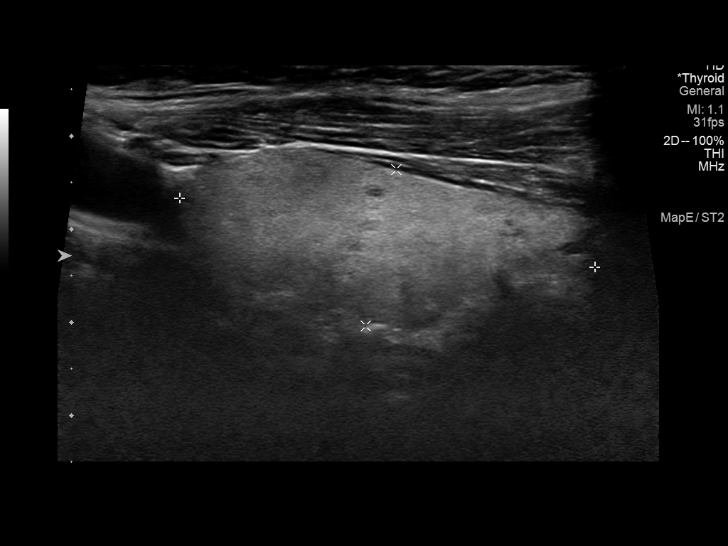
[im 8/44]
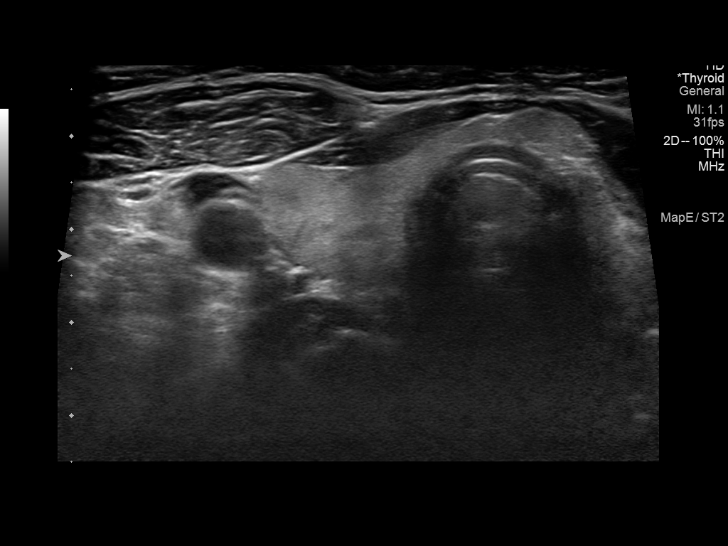
[im 11/44]
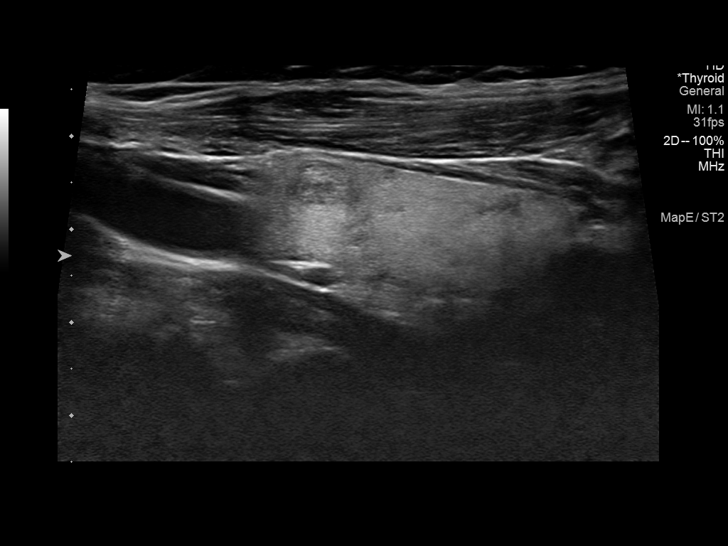
[im 15/44]
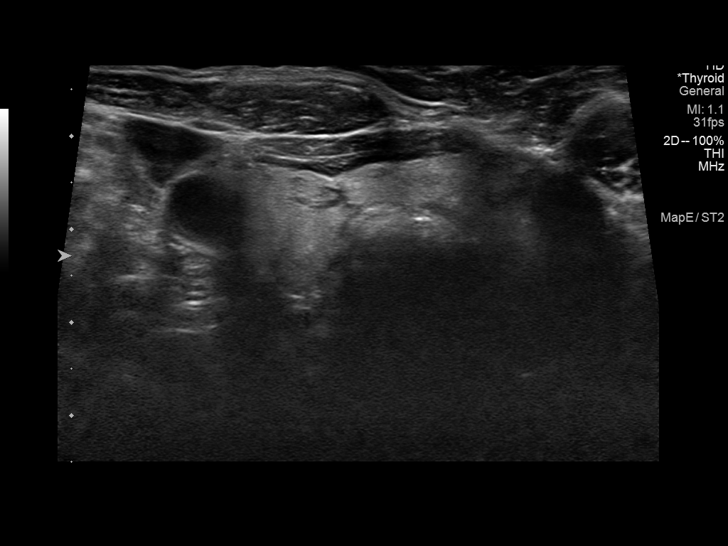
[im 18/44]
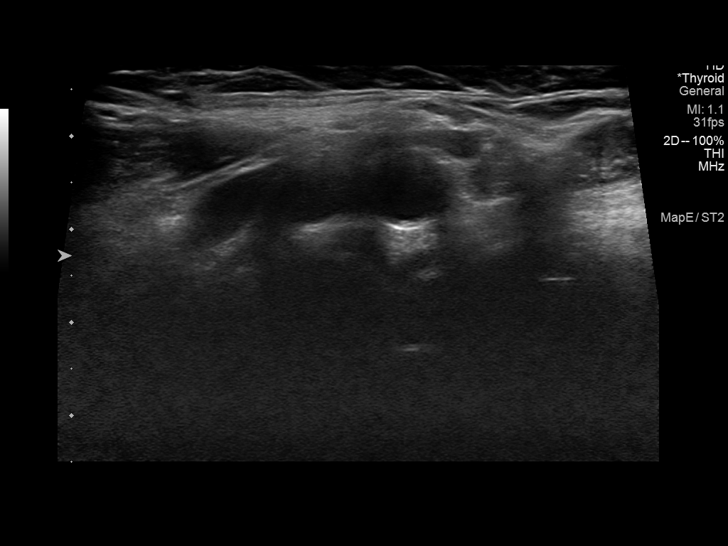
[im 22/44]
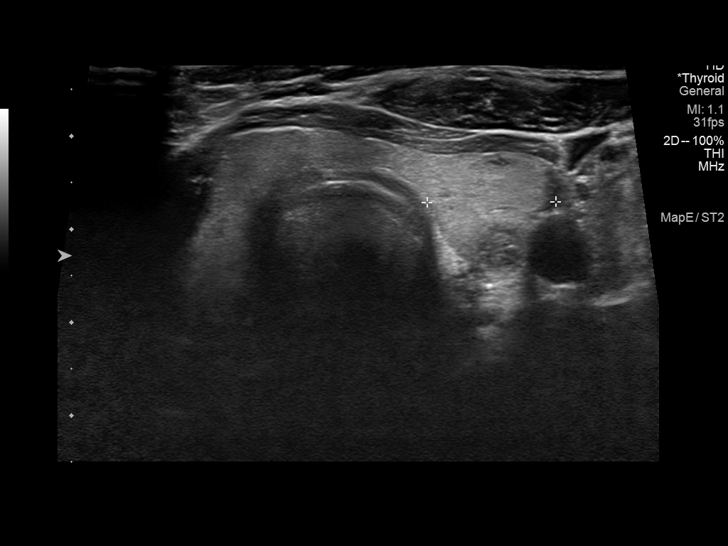
[im 26/44]
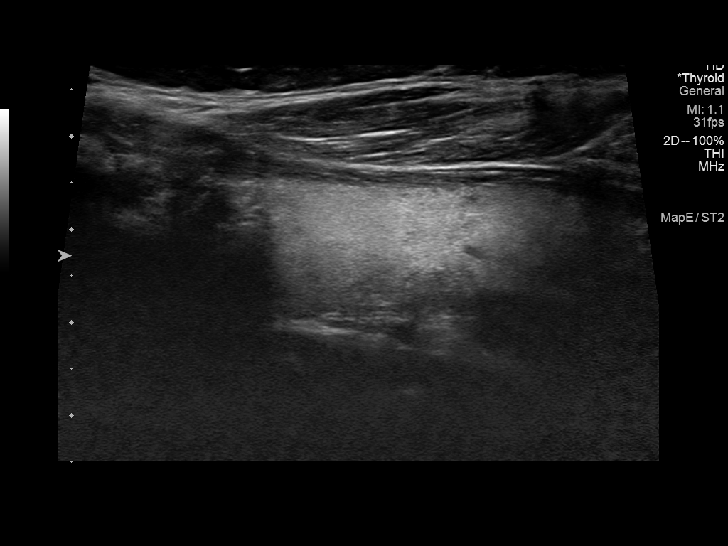
[im 29/44]
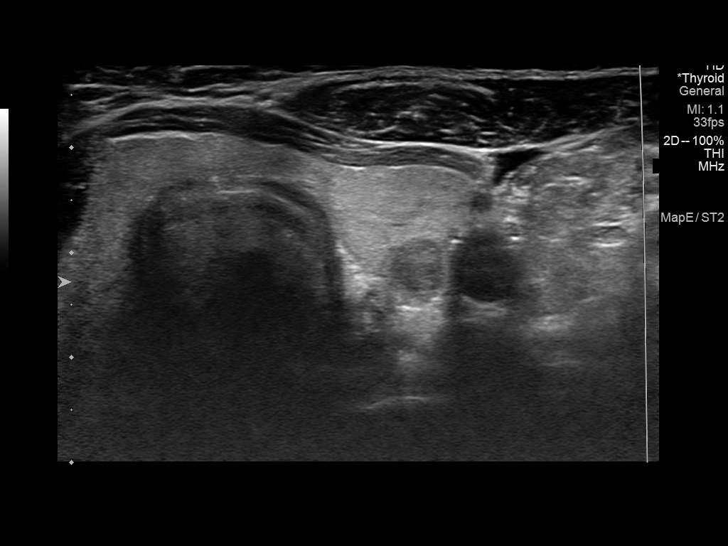
[im 33/44]
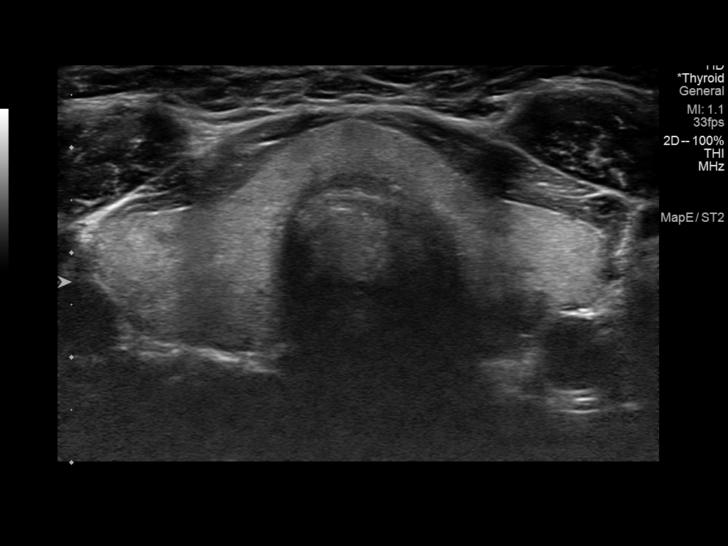
[im 36/44]
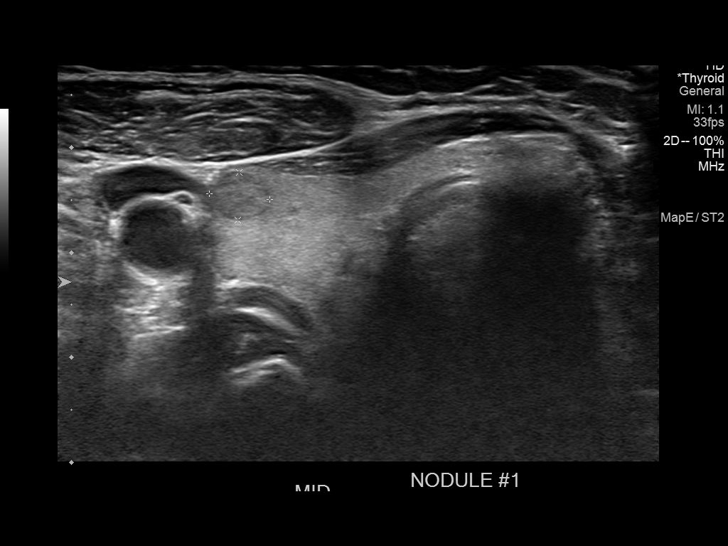
[im 40/44]
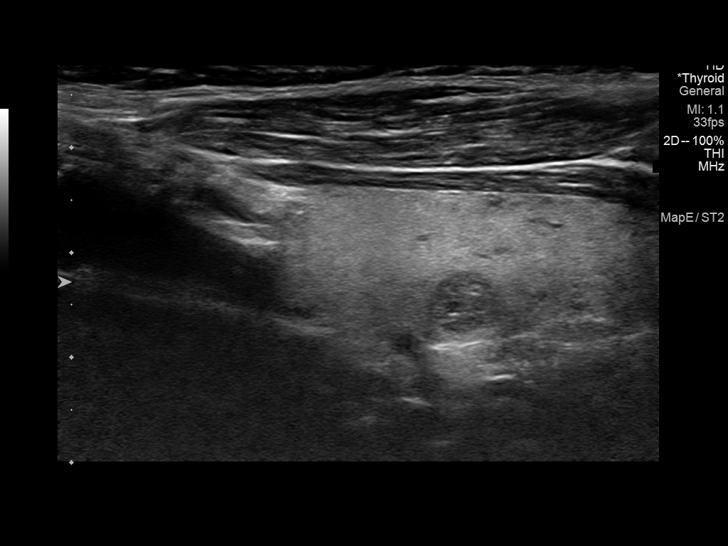
[im 44/44]
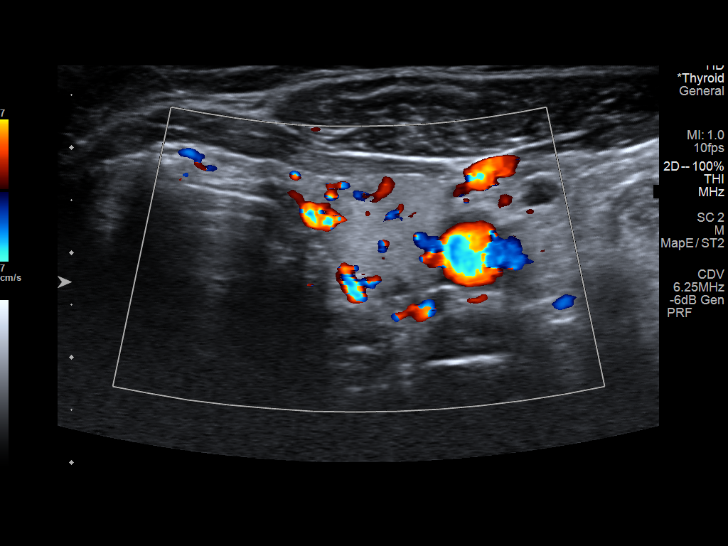

[13 of 25 positions shown; findings below may reference images not displayed]

FINDINGS: Parenchymal Echotexture: Mildly heterogenous

Isthmus: 0.4 cm thickness, previously

Right lobe: 4.5 x 1.7 x 1.6 cm, previously 4.5 x 1.3 x 2

Left lobe: 4.2 x 1.5 x 1.4 cm, previously 4.9 x 1.4 x

_________________________________________________________

Estimated total number of nodules >/= 1 cm: 0

Number of spongiform nodules >/=  2 cm not described below (TR1): 0

Number of mixed cystic and solid nodules >/= 1.5 cm not described
below (TR2): 0

_________________________________________________________

0.6 cm mid right nodule without calcifications, previously 0.7; This
nodule does NOT meet TI-RADS criteria for biopsy or dedicated
follow-up.

0.7 cm mid left hypoechoic nodule, previously 0.6 on 01/17/2011;
stability for greater than 5 years implies benignity
IMPRESSION: 1. Stable thyroid with subcentimeter nodules. None meets criteria
for biopsy or dedicated imaging follow-up.

The above is in keeping with the ACR TI-RADS recommendations - [HOSPITAL] 4324;[DATE].

## 2021-07-03 DIAGNOSIS — M1990 Unspecified osteoarthritis, unspecified site: Secondary | ICD-10-CM | POA: Insufficient documentation

## 2021-07-03 DIAGNOSIS — I10 Essential (primary) hypertension: Secondary | ICD-10-CM | POA: Insufficient documentation

## 2021-07-03 HISTORY — DX: Unspecified osteoarthritis, unspecified site: M19.90

## 2021-07-03 HISTORY — DX: Essential (primary) hypertension: I10

## 2022-01-10 DIAGNOSIS — E611 Iron deficiency: Secondary | ICD-10-CM | POA: Diagnosis not present

## 2022-01-10 DIAGNOSIS — E538 Deficiency of other specified B group vitamins: Secondary | ICD-10-CM | POA: Diagnosis not present

## 2022-01-10 DIAGNOSIS — E559 Vitamin D deficiency, unspecified: Secondary | ICD-10-CM | POA: Diagnosis not present

## 2022-01-10 DIAGNOSIS — E1169 Type 2 diabetes mellitus with other specified complication: Secondary | ICD-10-CM | POA: Diagnosis not present

## 2022-01-10 DIAGNOSIS — E785 Hyperlipidemia, unspecified: Secondary | ICD-10-CM | POA: Diagnosis not present

## 2022-01-10 DIAGNOSIS — E049 Nontoxic goiter, unspecified: Secondary | ICD-10-CM | POA: Diagnosis not present

## 2022-01-15 DIAGNOSIS — Z0000 Encounter for general adult medical examination without abnormal findings: Secondary | ICD-10-CM | POA: Diagnosis not present

## 2023-04-25 ENCOUNTER — Other Ambulatory Visit: Payer: Self-pay | Admitting: Family Medicine

## 2023-04-25 DIAGNOSIS — E049 Nontoxic goiter, unspecified: Secondary | ICD-10-CM

## 2023-05-13 ENCOUNTER — Other Ambulatory Visit: Payer: Medicare HMO

## 2023-05-15 ENCOUNTER — Ambulatory Visit
Admission: RE | Admit: 2023-05-15 | Discharge: 2023-05-15 | Disposition: A | Discharge status: Home / Self Care | Payer: Medicare HMO | Source: Ambulatory Visit | Attending: Family Medicine | Admitting: Family Medicine

## 2023-05-15 DIAGNOSIS — E049 Nontoxic goiter, unspecified: Secondary | ICD-10-CM | POA: Diagnosis not present

## 2023-05-21 DIAGNOSIS — M542 Cervicalgia: Secondary | ICD-10-CM | POA: Diagnosis not present

## 2023-05-21 DIAGNOSIS — R198 Other specified symptoms and signs involving the digestive system and abdomen: Secondary | ICD-10-CM | POA: Diagnosis not present

## 2023-05-21 DIAGNOSIS — E049 Nontoxic goiter, unspecified: Secondary | ICD-10-CM | POA: Diagnosis not present

## 2023-08-06 DIAGNOSIS — H52209 Unspecified astigmatism, unspecified eye: Secondary | ICD-10-CM | POA: Diagnosis not present

## 2023-08-06 DIAGNOSIS — H524 Presbyopia: Secondary | ICD-10-CM | POA: Diagnosis not present

## 2023-08-06 DIAGNOSIS — H5203 Hypermetropia, bilateral: Secondary | ICD-10-CM | POA: Diagnosis not present

## 2023-10-02 ENCOUNTER — Other Ambulatory Visit: Payer: Self-pay

## 2023-10-02 ENCOUNTER — Emergency Department (HOSPITAL_BASED_OUTPATIENT_CLINIC_OR_DEPARTMENT_OTHER): Payer: Medicare HMO

## 2023-10-02 ENCOUNTER — Emergency Department (HOSPITAL_BASED_OUTPATIENT_CLINIC_OR_DEPARTMENT_OTHER)
Admission: EM | Admit: 2023-10-02 | Discharge: 2023-10-03 | Disposition: A | Discharge status: Home / Self Care | Payer: Medicare HMO | Attending: Emergency Medicine | Admitting: Emergency Medicine

## 2023-10-02 ENCOUNTER — Encounter (HOSPITAL_BASED_OUTPATIENT_CLINIC_OR_DEPARTMENT_OTHER): Payer: Self-pay | Admitting: Emergency Medicine

## 2023-10-02 DIAGNOSIS — I10 Essential (primary) hypertension: Secondary | ICD-10-CM | POA: Diagnosis not present

## 2023-10-02 DIAGNOSIS — Z79899 Other long term (current) drug therapy: Secondary | ICD-10-CM | POA: Diagnosis not present

## 2023-10-02 DIAGNOSIS — R519 Headache, unspecified: Secondary | ICD-10-CM | POA: Diagnosis present

## 2023-10-02 HISTORY — DX: Essential (primary) hypertension: I10

## 2023-10-02 HISTORY — DX: Thyrotoxicosis with diffuse goiter without thyrotoxic crisis or storm: E05.00

## 2023-10-02 LAB — BASIC METABOLIC PANEL
Anion gap: 11 (ref 5–15)
BUN: 14 mg/dL (ref 8–23)
CO2: 28 mmol/L (ref 22–32)
Calcium: 10 mg/dL (ref 8.9–10.3)
Chloride: 99 mmol/L (ref 98–111)
Creatinine, Ser: 0.79 mg/dL (ref 0.44–1.00)
GFR, Estimated: >60 mL/min (ref >60)
Glucose, Bld: 100 mg/dL — ABNORMAL HIGH (ref 70–99)
Potassium: 3.2 mmol/L — ABNORMAL LOW (ref 3.5–5.1)
Sodium: 138 mmol/L (ref 135–145)

## 2023-10-02 LAB — CBC
HCT: 35.8 % — ABNORMAL LOW (ref 36.0–46.0)
Hemoglobin: 11.8 g/dL — ABNORMAL LOW (ref 12.0–15.0)
MCH: 27.1 pg (ref 26.0–34.0)
MCHC: 33 g/dL (ref 30.0–36.0)
MCV: 82.3 fL (ref 80.0–100.0)
Platelets: 387 10*3/uL (ref 150–400)
RBC: 4.35 MIL/uL (ref 3.87–5.11)
RDW: 13.4 % (ref 11.5–15.5)
WBC: 6.4 10*3/uL (ref 4.0–10.5)
nRBC: 0 % (ref 0.0–0.2)

## 2023-10-02 LAB — TROPONIN I (HIGH SENSITIVITY): Troponin I (High Sensitivity): 2 ng/L (ref <18)

## 2023-10-02 MED ORDER — AMLODIPINE BESYLATE 5 MG PO TABS: 5.0000 mg | ORAL_TABLET | Freq: Every day | ORAL | 2 refills | Status: DC

## 2023-10-02 MED ORDER — AMLODIPINE BESYLATE 5 MG PO TABS
5.0000 mg | ORAL_TABLET | Freq: Once | ORAL | Status: AC
  Administered 2023-10-02: 5 mg via ORAL
  Filled 2023-10-02: qty 1

## 2023-10-02 NOTE — Discharge Instructions (Signed)
You were seen for your elevated blood pressure and headache in the emergency department.   At home, please take your blood pressure daily.  Start taking the amlodipine we prescribed you if your blood pressure is routinely above 150 systolic.    Check your MyChart online for the results of any tests that had not resulted by the time you left the emergency department.   Follow-up with your primary doctor in 1 week regarding your visit.    Return immediately to the emergency department if you experience any of the following: Worsening headache, vision changes, numbness or weakness of your arms or legs, or any other concerning symptoms.    Thank you for visiting our Emergency Department. It was a pleasure taking care of you today.

## 2023-10-02 NOTE — ED Provider Notes (Signed)
[place] EMERGENCY DEPARTMENT AT [facility] Provider Note   CSN: [id number] Arrival date & time: 10/02/23  1814     History {Add pertinent medical, surgical, social history, OB history to HPI:1} Chief Complaint  Patient presents with   Hypertension    Jillian Farley is a 73 y.o. female.  73 year old female with history of hypertension who presents emergency department with headache and high blood pressure.  Patient reports that for the past 3 weeks she has had an occipital headache.  Says that it was gradual in onset.  Currently 2/10 in severity.  Typically does not get headaches.  No vision changes, or weakness or numbness of her arms or legs.  Took her blood pressure today and noted that it was elevated at 167/80 and decided to come into the emergency department for evaluation.  Takes valsartan/hydrochlorothiazide 80/12.5 mg.       Home Medications Prior to Admission medications   Not on File      Allergies    Patient has no allergy information on record.    Review of Systems   Review of Systems  Physical Exam Updated Vital Signs BP (!) 168/81   Pulse 92   Temp 98.7 F (37.1 C)   Resp 18   Ht 5\' 2"  (1.575 m)   Wt 68 kg   SpO2 97%   BMI 27.44 kg/m  Physical Exam Vitals and nursing note reviewed.  Constitutional:      General: She is not in acute distress.    Appearance: She is well-developed.  HENT:     Head: Normocephalic and atraumatic.     Right Ear: External ear normal.     Left Ear: External ear normal.     Nose: Nose normal.  Eyes:     Extraocular Movements: Extraocular movements intact.     Conjunctiva/sclera: Conjunctivae normal.     Pupils: Pupils are equal, round, and reactive to light.  Cardiovascular:     Rate and Rhythm: Normal rate and regular rhythm.     Heart sounds: No murmur heard. Pulmonary:     Effort: Pulmonary effort is normal. No respiratory distress.     Breath sounds: Normal breath sounds.  Musculoskeletal:      Cervical back: Normal range of motion and neck supple.     Right lower leg: No edema.     Left lower leg: No edema.  Skin:    General: Skin is warm and dry.  Neurological:     Mental Status: She is alert.     Comments: MENTAL STATUS: AAOx3 CRANIAL NERVES: II: Pupils equal and reactive 4 mm BL, no RAPD, no VF deficits III, IV, VI: EOM intact, no gaze preference or deviation, no nystagmus. V: normal sensation to light touch in V1, V2, and V3 segments bilaterally VII: no facial weakness or asymmetry, no nasolabial fold flattening VIII: normal hearing to speech and finger friction IX, X: normal palatal elevation, no uvular deviation XI: 5/5 head turn and 5/5 shoulder shrug bilaterally XII: midline tongue protrusion MOTOR: 5/5 strength in R shoulder flexion, elbow flexion and extension, and grip strength. 5/5 strength in L shoulder flexion, elbow flexion and extension, and grip strength.  5/5 strength in R hip and knee flexion, knee extension, ankle plantar and dorsiflexion. 5/5 strength in L hip and knee flexion, knee extension, ankle plantar and dorsiflexion. SENSORY: Normal sensation to light touch in all extremities COORD: Normal finger to nose and heel to shin, no tremor, no dysmetria  Psychiatric:        Mood and Affect: Mood normal.     ED Results / Procedures / Treatments   Labs (all labs ordered are listed, but only abnormal results are displayed) Labs Reviewed  CBC - Abnormal; Notable for the following components:      Result Value   Hemoglobin 11.8 (*)    HCT 35.8 (*)    All other components within normal limits  BASIC METABOLIC PANEL - Abnormal; Notable for the following components:   Potassium 3.2 (*)    Glucose, Bld 100 (*)    All other components within normal limits  TROPONIN I (HIGH SENSITIVITY)    EKG None  Radiology No results found.  Procedures Procedures  {Document cardiac monitor, telemetry assessment procedure when appropriate:1}  Medications  Ordered in ED Medications  amLODipine (NORVASC) tablet 5 mg (has no administration in time range)    ED Course/ Medical Decision Making/ A&P   {   Click here for ABCD2, HEART and other calculatorsREFRESH Note before signing :1}                              Medical Decision Making Amount and/or Complexity of Data Reviewed Labs: ordered. Radiology: ordered.  Risk Prescription drug management.   ***  {Document critical care time when appropriate:1} {Document review of labs and clinical decision tools ie heart score, Chads2Vasc2 etc:1}  {Document your independent review of radiology images, and any outside records:1} {Document your discussion with family members, caretakers, and with consultants:1} {Document social determinants of health affecting pt's care:1} {Document your decision making why or why not admission, treatments were needed:1} Final Clinical Impression(s) / ED Diagnoses Final diagnoses:  None    Rx / DC Orders ED Discharge Orders     None

## 2023-10-02 NOTE — ED Triage Notes (Addendum)
HA starting day before yesterday. Concerned for HTN at home 130's, normally 120's sys BP. 160sys BP in triage.

## 2023-10-03 DIAGNOSIS — Z20822 Contact with and (suspected) exposure to covid-19: Secondary | ICD-10-CM | POA: Diagnosis not present

## 2023-10-03 DIAGNOSIS — G4489 Other headache syndrome: Secondary | ICD-10-CM | POA: Diagnosis not present

## 2023-10-03 DIAGNOSIS — R059 Cough, unspecified: Secondary | ICD-10-CM | POA: Diagnosis not present

## 2023-10-05 ENCOUNTER — Encounter (HOSPITAL_BASED_OUTPATIENT_CLINIC_OR_DEPARTMENT_OTHER): Payer: Self-pay

## 2023-10-05 ENCOUNTER — Emergency Department (HOSPITAL_BASED_OUTPATIENT_CLINIC_OR_DEPARTMENT_OTHER)
Admission: EM | Admit: 2023-10-05 | Discharge: 2023-10-05 | Disposition: A | Discharge status: Home / Self Care | Payer: Medicare HMO | Attending: Emergency Medicine | Admitting: Emergency Medicine

## 2023-10-05 ENCOUNTER — Other Ambulatory Visit: Payer: Self-pay

## 2023-10-05 DIAGNOSIS — Z79899 Other long term (current) drug therapy: Secondary | ICD-10-CM | POA: Insufficient documentation

## 2023-10-05 DIAGNOSIS — R519 Headache, unspecified: Secondary | ICD-10-CM | POA: Diagnosis not present

## 2023-10-05 DIAGNOSIS — I159 Secondary hypertension, unspecified: Secondary | ICD-10-CM | POA: Insufficient documentation

## 2023-10-05 DIAGNOSIS — I10 Essential (primary) hypertension: Secondary | ICD-10-CM | POA: Diagnosis not present

## 2023-10-05 LAB — CBC WITH DIFFERENTIAL/PLATELET
Abs Immature Granulocytes: 0 10*3/uL (ref 0.00–0.07)
Basophils Absolute: 0 10*3/uL (ref 0.0–0.1)
Basophils Relative: 0 %
Eosinophils Absolute: 0.1 10*3/uL (ref 0.0–0.5)
Eosinophils Relative: 2 %
HCT: 36.7 % (ref 36.0–46.0)
Hemoglobin: 11.9 g/dL — ABNORMAL LOW (ref 12.0–15.0)
Immature Granulocytes: 0 %
Lymphocytes Relative: 43 %
Lymphs Abs: 1.9 10*3/uL (ref 0.7–4.0)
MCH: 26.7 pg (ref 26.0–34.0)
MCHC: 32.4 g/dL (ref 30.0–36.0)
MCV: 82.5 fL (ref 80.0–100.0)
Monocytes Absolute: 0.3 10*3/uL (ref 0.1–1.0)
Monocytes Relative: 7 %
Neutro Abs: 2.1 10*3/uL (ref 1.7–7.7)
Neutrophils Relative %: 48 %
Platelets: 453 10*3/uL — ABNORMAL HIGH (ref 150–400)
RBC: 4.45 MIL/uL (ref 3.87–5.11)
RDW: 13.3 % (ref 11.5–15.5)
WBC: 4.4 10*3/uL (ref 4.0–10.5)
nRBC: 0 % (ref 0.0–0.2)

## 2023-10-05 LAB — BASIC METABOLIC PANEL
Anion gap: 8 (ref 5–15)
BUN: 11 mg/dL (ref 8–23)
CO2: 30 mmol/L (ref 22–32)
Calcium: 9.6 mg/dL (ref 8.9–10.3)
Chloride: 104 mmol/L (ref 98–111)
Creatinine, Ser: 0.75 mg/dL (ref 0.44–1.00)
GFR, Estimated: >60 mL/min (ref >60)
Glucose, Bld: 95 mg/dL (ref 70–99)
Potassium: 3.4 mmol/L — ABNORMAL LOW (ref 3.5–5.1)
Sodium: 142 mmol/L (ref 135–145)

## 2023-10-05 MED ORDER — POTASSIUM CHLORIDE CRYS ER 20 MEQ PO TBCR
40.0000 meq | EXTENDED_RELEASE_TABLET | Freq: Once | ORAL | Status: AC
  Administered 2023-10-05: 40 meq via ORAL
  Filled 2023-10-05: qty 2

## 2023-10-05 MED ORDER — MAGNESIUM SULFATE IN D5W 1-5 GM/100ML-% IV SOLN
1.0000 g | Freq: Once | INTRAVENOUS | Status: AC
  Administered 2023-10-05: 1 g via INTRAVENOUS
  Filled 2023-10-05: qty 100

## 2023-10-05 MED ORDER — METOCLOPRAMIDE HCL 5 MG/ML IJ SOLN
10.0000 mg | Freq: Once | INTRAMUSCULAR | Status: AC
  Administered 2023-10-05: 10 mg via INTRAVENOUS
  Filled 2023-10-05: qty 2

## 2023-10-05 MED ORDER — KETOROLAC TROMETHAMINE 15 MG/ML IJ SOLN
15.0000 mg | Freq: Once | INTRAMUSCULAR | Status: AC
  Administered 2023-10-05: 15 mg via INTRAVENOUS
  Filled 2023-10-05: qty 1

## 2023-10-05 MED ORDER — VALSARTAN 80 MG PO TABS: 160.0000 mg | ORAL_TABLET | Freq: Every day | ORAL | 0 refills | Status: DC

## 2023-10-05 MED ORDER — ACETAMINOPHEN 500 MG PO TABS
1000.0000 mg | ORAL_TABLET | Freq: Once | ORAL | Status: AC
  Administered 2023-10-05: 1000 mg via ORAL
  Filled 2023-10-05: qty 2

## 2023-10-05 NOTE — Discharge Instructions (Addendum)
While you were in the emergency room, you had blood work done that showed a mildly low potassium.  We gave you some oral potassium here in the emergency room.  A few foods that I recommend people that are high in potassium include peppers, spinach, bananas, and blueberries.  I recommend that you eat whichever one you like the most.  I misspoke in the room about how I would like you to take your valsartan.  Beginning tomorrow, I would like you to take 160 mg at your usual time.  You can take 2 of your 80 mg pills until you run out, and then you may begin taking the 160 mg pills that I have sent to your pharmacy.  You can pick those up anytime this week.  Please call your primary care doctor on Monday to set up a follow-up appointment to discuss your hypertension.

## 2023-10-05 NOTE — ED Provider Notes (Signed)
[place] EMERGENCY DEPARTMENT AT [facility] Provider Note   CSN: [id number] Arrival date & time: 10/05/23  1828     History  Chief Complaint  Patient presents with   Hypertension   Headache    Jillian Farley is a 73 y.o. female.  This is a 73 year old female presents emergency room today due to headache, questions about hypertension.  Patient was seen here earlier this week for headache and hypertension, was started on as needed for hypertension.  She has been checking her blood pressure rather frequently, has noticed that she has a headache.  She has not had any difficulty with her vision, difficulty walking or vomiting.   Hypertension Associated symptoms include headaches.  Headache      Home Medications Prior to Admission medications   Medication Sig Start Date End Date Taking? Authorizing Provider  valsartan (DIOVAN) 80 MG tablet Take 2 tablets (160 mg total) by mouth daily. 10/05/23  Yes Anders Simmonds T, DO  amLODipine (NORVASC) 5 MG tablet Take 1 tablet (5 mg total) by mouth daily. 10/02/23   Rondel Baton, MD      Allergies    Methimazole    Review of Systems   Review of Systems  Neurological:  Positive for headaches.    Physical Exam Updated Vital Signs BP (!) 142/66   Pulse 75   Temp (!) 97.5 F (36.4 C)   Resp 16   Ht 5\' 2"  (1.575 m)   Wt 68 kg   SpO2 97%   BMI 27.42 kg/m  Physical Exam Vitals reviewed.  HENT:     Head: Normocephalic.  Musculoskeletal:     Cervical back: Normal range of motion.  Neurological:     Mental Status: She is alert.     Cranial Nerves: No cranial nerve deficit or facial asymmetry.     Sensory: No sensory deficit.     Gait: Gait normal.     ED Results / Procedures / Treatments   Labs (all labs ordered are listed, but only abnormal results are displayed) Labs Reviewed  BASIC METABOLIC PANEL - Abnormal; Notable for the following components:      Result Value   Potassium 3.4 (*)    All other  components within normal limits  CBC WITH DIFFERENTIAL/PLATELET - Abnormal; Notable for the following components:   Hemoglobin 11.9 (*)    Platelets 453 (*)    All other components within normal limits    EKG None  Radiology No results found.  Procedures Procedures    Medications Ordered in ED Medications  potassium chloride SA (KLOR-CON M) CR tablet 40 mEq (has no administration in time range)  metoCLOPramide (REGLAN) injection 10 mg (10 mg Intravenous Given 10/05/23 2030)  acetaminophen (TYLENOL) tablet 1,000 mg (1,000 mg Oral Given 10/05/23 2037)  ketorolac (TORADOL) 15 MG/ML injection 15 mg (15 mg Intravenous Given 10/05/23 2030)  magnesium sulfate IVPB 1 g 100 mL (1 g Intravenous New Bag/Given 10/05/23 2037)    ED Course/ Medical Decision Making/ A&P                                 Medical Decision Making 73 year old female here today for headache and high blood pressure.  Differential diagnose include tension type headache, migraine type headache, less likely intracranial emergent, less likely intracranial mass, benign hypertension, less likely hypertensive urgency.  Plan- patient without any red flag symptoms of headache.  She does  have a history of migraine headaches in the past, has not had any vomiting or difficulty walking, no trouble with her vision.  Discussed CT imaging with the patient, she states she is extremely claustrophobic, and despite knowing that a CT does not fully uncover her, does not wish to proceed with a CT scan.  I think that this is reasonable given the patient's exam.  I do not believe that this is trigeminal neuralgia or a giant cell arteritis.  Will attempt to treat the patient's headache.  Regarding patient's hypertension, systolic pressure initially elevated at 170.  She is currently only taking 80 mg of her valsartan.  Believe that she would benefit from increasing that dose rather than adding a secondary agent.  Did obtain basic labs on the patient  which did show mild hypokalemia of 3.4.  Did provide her with some oral potassium here.  Reassessment 9:55 PM-patient's headache has improved and resolved.  She ambulated out of the emergency department without difficulty.  Discharge.  Amount and/or Complexity of Data Reviewed Labs: ordered.  Risk OTC drugs. Prescription drug management.           Final Clinical Impression(s) / ED Diagnoses Final diagnoses:  Nonintractable headache, unspecified chronicity pattern, unspecified headache type  Secondary hypertension    Rx / DC Orders ED Discharge Orders          Ordered    valsartan (DIOVAN) 80 MG tablet  Daily        10/05/23 2158              Anders Simmonds T, DO 10/05/23 2159

## 2023-10-05 NOTE — ED Triage Notes (Signed)
Patient arrives with complaints of persistent headache and hypertension x3 days. Patient was seen here earlier this week for the same, and started on blood pressure meds. She was recommended to have a scan of her head, but declined previously due to anxiety. Rates her pain a 4/10.

## 2023-10-09 DIAGNOSIS — I10 Essential (primary) hypertension: Secondary | ICD-10-CM | POA: Diagnosis not present

## 2023-10-09 DIAGNOSIS — R519 Headache, unspecified: Secondary | ICD-10-CM | POA: Diagnosis not present

## 2023-10-09 DIAGNOSIS — E876 Hypokalemia: Secondary | ICD-10-CM | POA: Diagnosis not present

## 2023-10-31 DIAGNOSIS — R030 Elevated blood-pressure reading, without diagnosis of hypertension: Secondary | ICD-10-CM | POA: Diagnosis not present

## 2023-10-31 DIAGNOSIS — R519 Headache, unspecified: Secondary | ICD-10-CM | POA: Diagnosis not present

## 2023-10-31 DIAGNOSIS — M1710 Unilateral primary osteoarthritis, unspecified knee: Secondary | ICD-10-CM | POA: Diagnosis not present

## 2023-11-07 DIAGNOSIS — E119 Type 2 diabetes mellitus without complications: Secondary | ICD-10-CM | POA: Diagnosis not present

## 2023-11-07 DIAGNOSIS — M1710 Unilateral primary osteoarthritis, unspecified knee: Secondary | ICD-10-CM | POA: Diagnosis not present

## 2023-11-07 DIAGNOSIS — I10 Essential (primary) hypertension: Secondary | ICD-10-CM | POA: Diagnosis not present

## 2023-11-26 DIAGNOSIS — R69 Illness, unspecified: Secondary | ICD-10-CM | POA: Diagnosis not present

## 2023-11-26 DIAGNOSIS — H20021 Recurrent acute iridocyclitis, right eye: Secondary | ICD-10-CM | POA: Diagnosis not present

## 2023-11-29 ENCOUNTER — Other Ambulatory Visit: Payer: Self-pay | Admitting: Family Medicine

## 2023-11-29 DIAGNOSIS — R519 Headache, unspecified: Secondary | ICD-10-CM

## 2023-11-29 DIAGNOSIS — R251 Tremor, unspecified: Secondary | ICD-10-CM

## 2023-11-29 DIAGNOSIS — H539 Unspecified visual disturbance: Secondary | ICD-10-CM

## 2023-12-13 DIAGNOSIS — H5203 Hypermetropia, bilateral: Secondary | ICD-10-CM | POA: Diagnosis not present

## 2024-01-01 DIAGNOSIS — E049 Nontoxic goiter, unspecified: Secondary | ICD-10-CM | POA: Diagnosis not present

## 2024-01-01 DIAGNOSIS — Z79899 Other long term (current) drug therapy: Secondary | ICD-10-CM | POA: Diagnosis not present

## 2024-01-01 DIAGNOSIS — E785 Hyperlipidemia, unspecified: Secondary | ICD-10-CM | POA: Diagnosis not present

## 2024-01-01 DIAGNOSIS — E1169 Type 2 diabetes mellitus with other specified complication: Secondary | ICD-10-CM | POA: Diagnosis not present

## 2024-01-01 DIAGNOSIS — E559 Vitamin D deficiency, unspecified: Secondary | ICD-10-CM | POA: Diagnosis not present

## 2024-01-02 DIAGNOSIS — E1169 Type 2 diabetes mellitus with other specified complication: Secondary | ICD-10-CM | POA: Diagnosis not present

## 2024-01-02 DIAGNOSIS — E049 Nontoxic goiter, unspecified: Secondary | ICD-10-CM | POA: Diagnosis not present

## 2024-01-02 DIAGNOSIS — E785 Hyperlipidemia, unspecified: Secondary | ICD-10-CM | POA: Diagnosis not present

## 2024-01-02 DIAGNOSIS — Z79899 Other long term (current) drug therapy: Secondary | ICD-10-CM | POA: Diagnosis not present

## 2024-01-02 DIAGNOSIS — E559 Vitamin D deficiency, unspecified: Secondary | ICD-10-CM | POA: Diagnosis not present

## 2024-01-02 DIAGNOSIS — Z0000 Encounter for general adult medical examination without abnormal findings: Secondary | ICD-10-CM | POA: Diagnosis not present

## 2024-01-03 DIAGNOSIS — Z0100 Encounter for examination of eyes and vision without abnormal findings: Secondary | ICD-10-CM | POA: Diagnosis not present

## 2024-01-10 DIAGNOSIS — R69 Illness, unspecified: Secondary | ICD-10-CM | POA: Diagnosis not present

## 2024-01-10 DIAGNOSIS — H53143 Visual discomfort, bilateral: Secondary | ICD-10-CM | POA: Diagnosis not present

## 2024-02-02 ENCOUNTER — Other Ambulatory Visit: Payer: Self-pay

## 2024-02-02 ENCOUNTER — Emergency Department (HOSPITAL_COMMUNITY)

## 2024-02-02 ENCOUNTER — Emergency Department (HOSPITAL_COMMUNITY)
Admission: EM | Admit: 2024-02-02 | Discharge: 2024-02-02 | Disposition: A | Discharge status: Home / Self Care | Attending: Emergency Medicine | Admitting: Emergency Medicine

## 2024-02-02 DIAGNOSIS — R531 Weakness: Secondary | ICD-10-CM | POA: Diagnosis not present

## 2024-02-02 DIAGNOSIS — R42 Dizziness and giddiness: Secondary | ICD-10-CM | POA: Diagnosis not present

## 2024-02-02 DIAGNOSIS — I10 Essential (primary) hypertension: Secondary | ICD-10-CM | POA: Diagnosis not present

## 2024-02-02 DIAGNOSIS — R55 Syncope and collapse: Secondary | ICD-10-CM | POA: Insufficient documentation

## 2024-02-02 DIAGNOSIS — J0100 Acute maxillary sinusitis, unspecified: Secondary | ICD-10-CM | POA: Insufficient documentation

## 2024-02-02 DIAGNOSIS — Z79899 Other long term (current) drug therapy: Secondary | ICD-10-CM | POA: Insufficient documentation

## 2024-02-02 LAB — CBC WITH DIFFERENTIAL/PLATELET
Abs Immature Granulocytes: 0.01 10*3/uL (ref 0.00–0.07)
Basophils Absolute: 0 10*3/uL (ref 0.0–0.1)
Basophils Relative: 0 %
Eosinophils Absolute: 0.1 10*3/uL (ref 0.0–0.5)
Eosinophils Relative: 1 %
HCT: 40 % (ref 36.0–46.0)
Hemoglobin: 13 g/dL (ref 12.0–15.0)
Immature Granulocytes: 0 %
Lymphocytes Relative: 49 %
Lymphs Abs: 3 10*3/uL (ref 0.7–4.0)
MCH: 27.1 pg (ref 26.0–34.0)
MCHC: 32.5 g/dL (ref 30.0–36.0)
MCV: 83.5 fL (ref 80.0–100.0)
Monocytes Absolute: 0.3 10*3/uL (ref 0.1–1.0)
Monocytes Relative: 6 %
Neutro Abs: 2.7 10*3/uL (ref 1.7–7.7)
Neutrophils Relative %: 44 %
Platelets: 449 10*3/uL — ABNORMAL HIGH (ref 150–400)
RBC: 4.79 MIL/uL (ref 3.87–5.11)
RDW: 13.5 % (ref 11.5–15.5)
WBC: 6.1 10*3/uL (ref 4.0–10.5)
nRBC: 0 % (ref 0.0–0.2)

## 2024-02-02 LAB — COMPREHENSIVE METABOLIC PANEL WITH GFR
ALT: 15 U/L (ref 0–44)
AST: 24 U/L (ref 15–41)
Albumin: 4.3 g/dL (ref 3.5–5.0)
Alkaline Phosphatase: 61 U/L (ref 38–126)
Anion gap: 11 (ref 5–15)
BUN: 13 mg/dL (ref 8–23)
CO2: 29 mmol/L (ref 22–32)
Calcium: 10 mg/dL (ref 8.9–10.3)
Chloride: 97 mmol/L — ABNORMAL LOW (ref 98–111)
Creatinine, Ser: 0.99 mg/dL (ref 0.44–1.00)
GFR, Estimated: >60 mL/min (ref >60)
Glucose, Bld: 135 mg/dL — ABNORMAL HIGH (ref 70–99)
Potassium: 3.6 mmol/L (ref 3.5–5.1)
Sodium: 137 mmol/L (ref 135–145)
Total Bilirubin: 1.3 mg/dL — ABNORMAL HIGH (ref 0.0–1.2)
Total Protein: 8.1 g/dL (ref 6.5–8.1)

## 2024-02-02 LAB — TROPONIN I (HIGH SENSITIVITY)
Troponin I (High Sensitivity): 3 ng/L (ref <18)
Troponin I (High Sensitivity): 3 ng/L (ref <18)

## 2024-02-02 LAB — CBG MONITORING, ED: Glucose-Capillary: 131 mg/dL — ABNORMAL HIGH (ref 70–99)

## 2024-02-02 MED ORDER — AMOXICILLIN-POT CLAVULANATE 875-125 MG PO TABS: 1.0000 | ORAL_TABLET | Freq: Two times a day (BID) | ORAL | 0 refills | Status: AC

## 2024-02-02 NOTE — ED Provider Notes (Signed)
 [facility] AT [facility] Provider Note   CSN: [id number] Arrival date & time: 02/02/24  1344     History  Chief Complaint  Patient presents with   Near Syncope    Jillian Farley is a 73 y.o. female.  Patient with history of hypertension presents today with complaints of near syncope. She states that she has been feeling well throughout the day today, went to church and after the service was standing up talking to members of the congregation when she started to feel nauseous, lightheaded, and sweaty. She sat down in the pew at church and put her head down and in about 15 minutes began to feel back to normal. Members of the church called EMS who transported her here for evaluation. Does note she had a similar episode about 3 weeks ago, was not seen or evaluated at that time. Denies ever feeling any chest pain or shortness of breath. Currently patient feels back to normal. Denies any cardiac history. No recent travel or surgeries, no leg pain or leg swelling. Does note that she has had some sinus congestion for the past few days and has been taking flonase for this with improvement. No headaches or vision changes.   The history is provided by the patient. No language interpreter was used.  Near Syncope       Home Medications Prior to Admission medications   Medication Sig Start Date End Date Taking? Authorizing Provider  amLODipine  (NORVASC ) 5 MG tablet Take 1 tablet (5 mg total) by mouth daily. 10/02/23   Ninetta Basket, MD  valsartan  (DIOVAN ) 80 MG tablet Take 2 tablets (160 mg total) by mouth daily. 10/05/23   Nathanael Baker, DO      Allergies    Methimazole    Review of Systems   Review of Systems  Cardiovascular:  Positive for near-syncope.  All other systems reviewed and are negative.   Physical Exam Updated Vital Signs BP (!) 140/78 (BP Location: Right Arm)   Pulse 83   Temp 97.8 F (36.6 C) (Oral)   Resp 19   Ht 5\' 2"  (1.575  m)   Wt 63 kg   SpO2 100%   BMI 25.42 kg/m  Physical Exam Vitals and nursing note reviewed.  Constitutional:      General: She is not in acute distress.    Appearance: Normal appearance. She is normal weight. She is not ill-appearing, toxic-appearing or diaphoretic.  HENT:     Head: Normocephalic and atraumatic.  Eyes:     Extraocular Movements: Extraocular movements intact.     Pupils: Pupils are equal, round, and reactive to light.  Cardiovascular:     Rate and Rhythm: Normal rate and regular rhythm.  Pulmonary:     Effort: Pulmonary effort is normal. No respiratory distress.     Breath sounds: Normal breath sounds.  Abdominal:     General: Abdomen is flat.     Palpations: Abdomen is soft.     Tenderness: There is no abdominal tenderness.  Musculoskeletal:        General: Normal range of motion.     Cervical back: Normal range of motion.     Right lower leg: No edema.     Left lower leg: No edema.  Skin:    General: Skin is warm and dry.  Neurological:     General: No focal deficit present.     Mental Status: She is alert and oriented to person, place, and  time.     Cranial Nerves: No cranial nerve deficit.  Psychiatric:        Mood and Affect: Mood normal.        Behavior: Behavior normal.     ED Results / Procedures / Treatments   Labs (all labs ordered are listed, but only abnormal results are displayed) Labs Reviewed  CBC WITH DIFFERENTIAL/PLATELET - Abnormal; Notable for the following components:      Result Value   Platelets 449 (*)    All other components within normal limits  CBG MONITORING, ED - Abnormal; Notable for the following components:   Glucose-Capillary 131 (*)    All other components within normal limits  COMPREHENSIVE METABOLIC PANEL WITH GFR  TROPONIN I (HIGH SENSITIVITY)    EKG EKG Interpretation Date/Time:  Sunday February 02 2024 14:00:46 EDT Ventricular Rate:  78 PR Interval:  164 QRS Duration:  78 QT Interval:  383 QTC  Calculation: 437 R Axis:   24  Text Interpretation: Sinus rhythm Low voltage, precordial leads Borderline T abnormalities, anterior leads Confirmed by Jerald Molly [phone]) on 02/02/2024 2:12:37 PM  Radiology No results found.  Procedures Procedures    Medications Ordered in ED Medications - No data to display  ED Course/ Medical Decision Making/ A&P                                 Medical Decision Making Amount and/or Complexity of Data Reviewed Labs: ordered. Radiology: ordered.   This patient is a 73 y.o. female who presents to the ED for concern of near syncope, this involves an extensive number of treatment options, and is a complaint that carries with it a high risk of complications and morbidity. The emergent differential diagnosis prior to evaluation includes, but is not limited to,  CVA, ACS, arrhythmia, vasovagal / orthostatic hypotension, sepsis, hypoglycemia, electrolyte disturbance, respiratory failure, anemia, dehydration, heat injury, polypharmacy, malignancy, anxiety/panic attack.   This is not an exhaustive differential.   Past Medical History / Co-morbidities / Social History:  has a past medical history of Graves' disease and Hypertension.  Additional history: Chart reviewed. Pertinent results include: had echo in 2022 showed EF 60-65%  Physical Exam: Physical exam performed. The pertinent findings include: well appearing, alert and oriented.   Lab Tests: I ordered, and personally interpreted labs.  The pertinent results include:  no leukocytosis, CMP and troponin pending at shift change   Imaging Studies: I ordered imaging studies including CXR.   Imaging pending at shift change   Cardiac Monitoring:  The patient was maintained on a cardiac monitor.  My attending physician Dr. Gordon Latus viewed and interpreted the cardiac monitored which showed an underlying rhythm of: sinus rhythm, no STEMI. I agree with this interpretation.   Disposition:  Patients  labs, chest x-ray are pending at shift change, will determine dispo. If normal, plan to closely monitor to ensure no recurrent symptoms. If she continues to feel well without symptom recurrence, suspect likely discharge with outpatient cardiology referral. If any symptoms reoccur, likely plan for admission for syncope work-up given second episode of symptoms.   Care handoff to Ariel, Zelaya, PA-C at shift change. Please see their note for further information.  I discussed this case with my attending physician Dr. Gordon Latus who cosigned this note including patient's presenting symptoms, physical exam, and planned diagnostics and interventions. Attending physician stated agreement with plan or made changes to plan which were implemented.  Final Clinical Impression(s) / ED Diagnoses Final diagnoses:  None    Rx / DC Orders ED Discharge Orders     None         Fredna Jasper 02/02/24 1535    Arvilla Birmingham, MD 02/03/24 1020

## 2024-02-02 NOTE — Discharge Instructions (Signed)
 You were seen in the emergency department today for concerns of a near syncopal episode.  Your labs and imaging were all thankfully reassuring without any obvious findings seen to explain the symptoms.  Based on your reported pressure over your right cheek and towards the back of the eye, I do have some concerns for possible sinus infection as this has been ongoing for around 3 weeks.  I started you on Augmentin which is an antibiotic that you will take twice daily for the next 7 days.  Please make sure you take this in its entirety even if your symptoms resolved.  For any concerns of new or worsening symptoms, return to the emergency department.  Otherwise please follow-up with your primary care provider.  A referral was placed to the cardiologist for you to have evaluation with them regarding this near syncopal episode.

## 2024-02-02 NOTE — ED Notes (Signed)
 Discharged by RN, in wheelchair to lobby to be picked up by husband.

## 2024-02-02 NOTE — ED Provider Notes (Signed)
 Accepted handoff at shift change from Smoot, PA-C. Please see prior provider note for more detail.   Briefly: Patient is 73 y.o.   DDX: concern for vasovagal syncope, cardiac syncope, ACS, stroke, concussion, sepsis  Plan: Disposition per results of lab workup and chest x-ray.  Will likely benefit from outpatient cardiology follow-up if workup is reassuring.  Physical Exam  BP 100/68 (BP Location: Right Arm)   Pulse 83   Temp 97.9 F (36.6 C) (Oral)   Resp 20   Ht 5\' 2"  (1.575 m)   Wt 63 kg   SpO2 100%   BMI 25.42 kg/m   Physical Exam Vitals and nursing note reviewed.  Constitutional:      General: She is not in acute distress.    Appearance: Normal appearance. She is normal weight. She is not ill-appearing, toxic-appearing or diaphoretic.  HENT:     Head: Normocephalic and atraumatic.     Comments: Tenderness palpation overlying the right maxillary sinus.  Eyes:     Extraocular Movements: Extraocular movements intact.     Pupils: Pupils are equal, round, and reactive to light.  Cardiovascular:     Rate and Rhythm: Normal rate and regular rhythm.  Pulmonary:     Effort: Pulmonary effort is normal. No respiratory distress.     Breath sounds: Normal breath sounds.  Abdominal:     General: Abdomen is flat.     Palpations: Abdomen is soft.     Tenderness: There is no abdominal tenderness.  Musculoskeletal:        General: Normal range of motion.     Cervical back: Normal range of motion.     Right lower leg: No edema.     Left lower leg: No edema.  Skin:    General: Skin is warm and dry.  Neurological:     General: No focal deficit present.     Mental Status: She is alert and oriented to person, place, and time.     Cranial Nerves: No cranial nerve deficit.  Psychiatric:        Mood and Affect: Mood normal.        Behavior: Behavior normal.     Procedures  Procedures  ED Course / MDM    Medical Decision Making Amount and/or Complexity of Data Reviewed Labs:  ordered. Radiology: ordered.   Patient's workup is thankfully reassuring.  Troponin initially at 3 with repeat remaining consistent at 3.  No significant elevation to suggest underlying ACS, NSTEMI, or other cause of near syncopal episode from a cardiac standpoint alone.  After informed patient of findings, she did once again reiterates that she has been having right-sided sinus pressure.  She does report minimal productive coughing or blowing of her nose.  With prolonged course of pain and pressure overlying this area, I am concerned for possible sinusitis although I do not suspect that this is likely the cause of her near syncopal episode.  Will start patient on a course of Augmentin twice daily for the next 7 days.  Encourage close follow-up with primary care provider for repeat evaluation.  Also placed referral to cardiology for evaluation of this near syncopal episode that patient experience.  Patient verbalized understanding all return precautions and is discharged home in stable condition.    Eleena Grater A, PA-C 02/02/24 1816    Mordecai Applebaum, MD 02/02/24 [phone]

## 2024-02-02 NOTE — ED Triage Notes (Signed)
 Patient arrives via [facility] for a near syncopal event from church. Patient endorses feeling woozy, dizzy, warm, and lightheaded. Assisted to floor by friends, recalls entire event. Sinus infection currently postocular pressure ad facial pressure. Alert and oriented x4.   BP 118/76 HR 80 NSR RR 18 O2 99 on room air CBG 162 12 lead unremarkable, no cardiac hx.

## 2024-02-02 NOTE — ED Notes (Signed)
 Patient ambulated to restroom with no difficulty. Gait even and unassisted. Denies dizziness at this time.

## 2024-02-05 DIAGNOSIS — R55 Syncope and collapse: Secondary | ICD-10-CM | POA: Diagnosis not present

## 2024-02-05 DIAGNOSIS — R251 Tremor, unspecified: Secondary | ICD-10-CM | POA: Diagnosis not present

## 2024-02-18 DIAGNOSIS — E7800 Pure hypercholesterolemia, unspecified: Secondary | ICD-10-CM | POA: Insufficient documentation

## 2024-02-18 DIAGNOSIS — N951 Menopausal and female climacteric states: Secondary | ICD-10-CM | POA: Insufficient documentation

## 2024-02-18 DIAGNOSIS — E119 Type 2 diabetes mellitus without complications: Secondary | ICD-10-CM | POA: Insufficient documentation

## 2024-02-18 DIAGNOSIS — E049 Nontoxic goiter, unspecified: Secondary | ICD-10-CM

## 2024-02-18 DIAGNOSIS — E663 Overweight: Secondary | ICD-10-CM

## 2024-02-18 HISTORY — DX: Nontoxic goiter, unspecified: E04.9

## 2024-02-18 HISTORY — DX: Overweight: E66.3

## 2024-02-18 HISTORY — DX: Type 2 diabetes mellitus without complications: E11.9

## 2024-02-18 HISTORY — DX: Menopausal and female climacteric states: N95.1

## 2024-02-18 HISTORY — DX: Pure hypercholesterolemia, unspecified: E78.00

## 2024-02-19 ENCOUNTER — Ambulatory Visit

## 2024-02-19 VITALS — BP 126/72 | HR 73 | Ht 62.0 in | Wt 143.6 lb

## 2024-02-19 DIAGNOSIS — R55 Syncope and collapse: Secondary | ICD-10-CM | POA: Insufficient documentation

## 2024-02-19 DIAGNOSIS — E7800 Pure hypercholesterolemia, unspecified: Secondary | ICD-10-CM

## 2024-02-19 DIAGNOSIS — E663 Overweight: Secondary | ICD-10-CM

## 2024-02-19 DIAGNOSIS — R011 Cardiac murmur, unspecified: Secondary | ICD-10-CM

## 2024-02-19 DIAGNOSIS — M1990 Unspecified osteoarthritis, unspecified site: Secondary | ICD-10-CM

## 2024-02-19 DIAGNOSIS — N951 Menopausal and female climacteric states: Secondary | ICD-10-CM

## 2024-02-19 DIAGNOSIS — E049 Nontoxic goiter, unspecified: Secondary | ICD-10-CM

## 2024-02-19 DIAGNOSIS — I10 Essential (primary) hypertension: Secondary | ICD-10-CM

## 2024-02-19 HISTORY — DX: Syncope and collapse: R55

## 2024-02-19 HISTORY — DX: Cardiac murmur, unspecified: R01.1

## 2024-02-19 NOTE — Progress Notes (Signed)
 Cardiology Consultation:    Date:  02/19/2024   ID:  Jillian Farley, DOB 1951-06-08, MRN [id number]  PCP:  Jillian Bertin, MD  Cardiologist:  Jillian Evans Jovonni Borquez, MD   Referring MD: Jillian Bertin, MD   Chief Complaint  Patient presents with   Loss of Consciousness     ASSESSMENT AND PLAN:   Jillian Farley 73 year old woman with history of prediabetes, hypertension, hyperlipidemia, here for evaluation of near syncopal episodes.  Problem List Items Addressed This Visit     Hypercholesteremia   Last lipid panel from 01/01/2024 total cholesterol 228, triglycerides 107, HDL 72, LDL 137. Currently not on statins. She is on a dietary modification regimen.  Would recommend statin therapy if she is willing.  Will review results from echocardiogram and heart monitor and make final determination subsequent follow-up visit.  In the interim she has an upcoming follow-up visit with her PCP and would recommend to further discuss statin therapy and initiate if she is agreeable.      Relevant Medications   VALSARTAN  PO   valsartan -hydrochlorothiazide (DIOVAN -HCT) 160-25 MG tablet   aspirin EC 81 MG tablet   Essential hypertension   Well-controlled on current regimen with valsartan -hydrochlorothiazide 160 mg - 25 mg once daily. Target below 130/80 mmHg.  Advised to monitor at home to check if she is having any significantly low blood pressures.        Relevant Medications   VALSARTAN  PO   valsartan -hydrochlorothiazide (DIOVAN -HCT) 160-25 MG tablet   aspirin EC 81 MG tablet   Heart murmur   Systolic murmur, best heard in right upper sternal border area. Has history of aortic valve stenosis on prior echocardiogram in 2022.  Follow-up blood transthoracic echocardiogram now in the setting of heart murmur and near syncopal episodes.      Relevant Orders   ECHOCARDIOGRAM COMPLETE   Near syncope - Primary   Couple episodes in the last 2 months.  Both appear vasovagal in  description occurring while standing in hot environment.  Brief episodes without loss of consciousness.  Hemodynamically stable. Does report hot flash like symptoms at times  Will assess with heart monitor for 14 days. Encouraged to keep herself well-hydrated and take extra time after changing position before she starts walking. Advised to sit down or lay down immediately at the onset of any symptoms.  For her heart murmur will further assess with transthoracic echocardiogram given her history of aortic valve sclerosis without stenosis on prior echocardiograms in April 2022.       Relevant Medications   VALSARTAN  PO   valsartan -hydrochlorothiazide (DIOVAN -HCT) 160-25 MG tablet   aspirin EC 81 MG tablet   Other Relevant Orders   EKG 12-Lead (Completed)   LONG TERM MONITOR (3-14 DAYS)    Return to tentatively in 2 months.  History of Present Illness:    Jillian Farley is a 72 y.o. female who is being seen today for the evaluation of near syncope at the request of Jillian Bertin, MD.  Pleasant woman here for the visit by herself.  She retired in September last year.    Has history of prediabetes, hypertension, hyperlipidemia, osteoarthritis of the knee Denies any prior history of CAD, CHF, MI.  Has had 2 episodes of near syncope. First occurrence more than a month ago when outdoors with her husband on a hot sunny day felt lightheaded, symptoms lasted for few minutes and subsided after she sat down in the car and was in the cooler setting.  The second  episode most recent occurred on June 1 1 at charged in the afternoon, was standing up when she started feeling symptoms of lightheadedness.  Sat down immediately, symptoms persisted and she was nearly passing out, bystanders helped her out.  She mentions she never completely lost consciousness.  But was weak with poor response.  EMS was called and she was taken to the ER.  Workup in the ER was unremarkable.  Mentions no further  episodes since. Keeps herself active. Denies any chest pain, shortness of breath, orthopnea, palpitations. Occasionally has hot flashes. No pedal edema. No blood in urine or stools.  Mentions blood pressures have been well-controlled on her current dose of valsartan -hydrochlorothiazide 160 mg - 25 mg once daily. Mentions she has been taking aspirin for at least couple years. Mentions that she takes meloxicam only on an as-needed basis rare occasions for osteoarthritis.   Does not smoke. Occasional alcohol consumption on social occasions. No substance abuse.  EKG in the clinic today shows sinus rhythm heart rate 73/min, PR interval 154 ms, nonspecific ST-T changes with QRS duration 70 ms.  Echocardiogram from 12/23/2020 noted normal biventricular function with EF 60 to 65%, grade 1 diastolic dysfunction, trace MR.  Recent labs from ER visit 02/02/2024 noted high-sensitivity troponin unremarkable. CMP with normal transaminases and alkaline phosphatase.  Mildly elevated total bilirubin 1.3. Sodium 137, potassium 3.6 BUN 13, creatinine 0.99 and EGFR greater than 60. CBC with hemoglobin 13 hematocrit 40, platelets 09/07/1945.  Past Medical History:  Diagnosis Date   Graves' disease    Hypertension     Past Surgical History:  Procedure Laterality Date   TONSILLECTOMY      Current Medications: Current Meds  Medication Sig   aspirin EC 81 MG tablet Take 81 mg by mouth daily. Swallow whole.   OVER THE COUNTER MEDICATION Apply 1 Application topically daily. Blue- Emu   valsartan -hydrochlorothiazide (DIOVAN -HCT) 160-25 MG tablet Take 1 tablet by mouth daily.   Vitamin D, Cholecalciferol, 10 MCG (400 UNIT) TABS Take 1 tablet by mouth daily.   [DISCONTINUED] amLODipine  (NORVASC ) 5 MG tablet Take 1 tablet (5 mg total) by mouth daily.   [DISCONTINUED] valsartan  (DIOVAN ) 80 MG tablet Take 2 tablets (160 mg total) by mouth daily.     Allergies:   Patient has no known allergies.   Social  History   Socioeconomic History   Marital status: Married    Spouse name: Not on file   Number of children: Not on file   Years of education: Not on file   Highest education level: Not on file  Occupational History   Not on file  Tobacco Use   Smoking status: Former    Types: Cigarettes   Smokeless tobacco: Never  Substance and Sexual Activity   Alcohol use: Not Currently   Drug use: Never   Sexual activity: Yes  Other Topics Concern   Not on file  Social History Narrative   Not on file   Social Drivers of Health   Financial Resource Strain: Not on file  Food Insecurity: Not on file  Transportation Needs: Not on file  Physical Activity: Not on file  Stress: Not on file (10/13/2019)  Social Connections: Not on file     Family History: The patient's family history includes Angina in her father; Cancer in her mother; Dementia in her mother; Healthy in her paternal grandmother. ROS:   Please see the history of present illness.    All 14 point review of systems negative except as  described per history of present illness.  EKGs/Labs/Other Studies Reviewed:    The following studies were reviewed today:   EKG:  EKG Interpretation Date/Time:  Wednesday February 19 2024 09:03:28 EDT Ventricular Rate:  73 PR Interval:  154 QRS Duration:  70 QT Interval:  354 QTC Calculation: 389 R Axis:   0  Text Interpretation: Normal sinus rhythm Nonspecific T wave abnormality Abnormal ECG When compared with ECG of 02-Feb-2024 14:00, PREVIOUS ECG IS PRESENT Confirmed by Bertha Broad reddy [phone]) on 02/19/2024 9:35:34 AM    Recent Labs: 02/02/2024: ALT 15; BUN 13; Creatinine, Ser 0.99; Hemoglobin 13.0; Platelets 449; Potassium 3.6; Sodium 137  Recent Lipid Panel No results found for: CHOL, TRIG, HDL, CHOLHDL, VLDL, LDLCALC, LDLDIRECT  Physical Exam:    VS:  BP 126/72 (BP Location: Right Arm, Patient Position: Sitting)   Pulse 73   Ht 5' 2 (1.575 m)   Wt 143 lb 9.6  oz (65.1 kg)   SpO2 94%   BMI 26.26 kg/m     Wt Readings from Last 3 Encounters:  02/19/24 143 lb 9.6 oz (65.1 kg)  02/02/24 139 lb (63 kg)  10/05/23 149 lb 14.6 oz (68 kg)     GENERAL:  Well nourished, well developed in no acute distress NECK: No JVD; No carotid bruits CARDIAC: RRR, S1 and S2 present, 4/6 ejection systolic murmur best heard right upper sternal border CHEST:  Clear to auscultation without rales, wheezing or rhonchi  Extremities: No pitting pedal edema. Pulses bilaterally symmetric with radial 2+ and dorsalis pedis 2+ NEUROLOGIC:  Alert and oriented x 3  Medication Adjustments/Labs and Tests Ordered: Current medicines are reviewed at length with the patient today.  Concerns regarding medicines are outlined above.  Orders Placed This Encounter  Procedures   LONG TERM MONITOR (3-14 DAYS)   EKG 12-Lead   ECHOCARDIOGRAM COMPLETE   No orders of the defined types were placed in this encounter.   Signed, Dickie Cloe reddy Lysander Calixte, MD, MPH, [facility] 02/19/2024 10:00 AM    [facility]

## 2024-02-19 NOTE — Assessment & Plan Note (Signed)
 Well-controlled on current regimen with valsartan -hydrochlorothiazide 160 mg - 25 mg once daily. Target below 130/80 mmHg.  Advised to monitor at home to check if she is having any significantly low blood pressures.

## 2024-02-19 NOTE — Patient Instructions (Addendum)
 Medication Instructions:  Your physician recommends that you continue on your current medications as directed. Please refer to the Current Medication list given to you today.  *If you need a refill on your cardiac medications before your next appointment, please call your pharmacy*   Lab Work: None Ordered If you have labs (blood work) drawn today and your tests are completely normal, you will receive your results only by: MyChart Message (if you have MyChart) OR A paper copy in the mail If you have any lab test that is abnormal or we need to change your treatment, we will call you to review the results.   Testing/Procedures: Your physician has requested that you have an echocardiogram. Echocardiography is a painless test that uses sound waves to create images of your heart. It provides your doctor with information about the size and shape of your heart and how well your heart's chambers and valves are working. This procedure takes approximately one hour. There are no restrictions for this procedure. Please do NOT wear cologne, perfume, aftershave, or lotions (deodorant is allowed). Please arrive 15 minutes prior to your appointment time.  Please note: We ask at that you not bring children with you during ultrasound (echo/ vascular) testing. Due to room size and safety concerns, children are not allowed in the ultrasound rooms during exams. Our front office staff cannot provide observation of children in our lobby area while testing is being conducted. An adult accompanying a patient to their appointment will only be allowed in the ultrasound room at the discretion of the ultrasound technician under special circumstances. We apologize for any inconvenience.    WHY IS MY DOCTOR PRESCRIBING ZIO? The Zio system is proven and trusted by physicians to detect and diagnose irregular heart rhythms -- and has been prescribed to hundreds of thousands of patients.  The FDA has cleared the Zio system to  monitor for many different kinds of irregular heart rhythms. In a study, physicians were able to reach a diagnosis 90% of the time with the Zio system1.  You can wear the Zio monitor -- a small, discreet, comfortable patch -- during your normal day-to-day activity, including while you sleep, shower, and exercise, while it records every single heartbeat for analysis.  1Barrett, P., et al. Comparison of 24 Hour Holter Monitoring Versus 14 Day Novel Adhesive Patch Electrocardiographic Monitoring. American Journal of Medicine, 2014.  ZIO VS. HOLTER MONITORING The Zio monitor can be comfortably worn for up to 14 days. Holter monitors can be worn for 24 to 48 hours, limiting the time to record any irregular heart rhythms you may have. Zio is able to capture data for the 51% of patients who have their first symptom-triggered arrhythmia after 48 hours.1  LIVE WITHOUT RESTRICTIONS The Zio ambulatory cardiac monitor is a small, unobtrusive, and water-resistant patch--you might even forget you're wearing it. The Zio monitor records and stores every beat of your heart, whether you're sleeping, working out, or showering.     Follow-Up: At [facility], you and your health needs are our priority.  As part of our continuing mission to provide you with exceptional heart care, we have created designated Provider Care Teams.  These Care Teams include your primary Cardiologist (physician) and Advanced Practice Providers (APPs -  Physician Assistants and Nurse Practitioners) who all work together to provide you with the care you need, when you need it.  We recommend signing up for the patient portal called MyChart.  Sign up information is provided on this  After Visit Summary.  MyChart is used to connect with patients for Virtual Visits (Telemedicine).  Patients are able to view lab/test results, encounter notes, upcoming appointments, etc.  Non-urgent messages can be sent to your provider as well.   To learn more  about what you can do with MyChart, go to [email or web address].    Your next appointment:   2 month(s)  The format for your next appointment:   In Person  Provider:   Bertha Broad, MD    Other Instructions NA

## 2024-02-19 NOTE — Assessment & Plan Note (Signed)
 Last lipid panel from 01/01/2024 total cholesterol 228, triglycerides 107, HDL 72, LDL 137. Currently not on statins. She is on a dietary modification regimen.  Would recommend statin therapy if she is willing.  Will review results from echocardiogram and heart monitor and make final determination subsequent follow-up visit.  In the interim she has an upcoming follow-up visit with her PCP and would recommend to further discuss statin therapy and initiate if she is agreeable.

## 2024-02-19 NOTE — Assessment & Plan Note (Signed)
 Systolic murmur, best heard in right upper sternal border area. Has history of aortic valve stenosis on prior echocardiogram in 2022.  Follow-up blood transthoracic echocardiogram now in the setting of heart murmur and near syncopal episodes.

## 2024-02-19 NOTE — Assessment & Plan Note (Signed)
 Couple episodes in the last 2 months.  Both appear vasovagal in description occurring while standing in hot environment.  Brief episodes without loss of consciousness.  Hemodynamically stable. Does report hot flash like symptoms at times  Will assess with heart monitor for 14 days. Encouraged to keep herself well-hydrated and take extra time after changing position before she starts walking. Advised to sit down or lay down immediately at the onset of any symptoms.  For her heart murmur will further assess with transthoracic echocardiogram given her history of aortic valve sclerosis without stenosis on prior echocardiograms in April 2022.

## 2024-02-24 ENCOUNTER — Ambulatory Visit (HOSPITAL_COMMUNITY)
Admission: RE | Admit: 2024-02-24 | Discharge: 2024-02-24 | Disposition: A | Discharge status: Home / Self Care | Source: Ambulatory Visit | Attending: Cardiology | Admitting: Cardiology

## 2024-02-24 DIAGNOSIS — R011 Cardiac murmur, unspecified: Secondary | ICD-10-CM | POA: Diagnosis not present

## 2024-02-24 LAB — ECHOCARDIOGRAM COMPLETE
Area-P 1/2: 4.1 cm2
S' Lateral: 2.5 cm

## 2024-02-26 ENCOUNTER — Telehealth: Payer: Self-pay

## 2024-02-26 NOTE — Telephone Encounter (Signed)
 Patient called the office stating that she had called Zio and they stated that they only work with the zio devices, they don't treat the medical issues. Spoke with Donovan Lesches regarding the patient's symptoms of constant itching and redness under the heart monitor and she stated to have her take off the monitor and send it in and then to let Dr. Maddireddy know. I contacted the patient and informed her to send in the monitor and that I would let Dr. Liborio know about her symptoms. Patient verbalized understanding and had no further questions at this time.

## 2024-02-26 NOTE — Telephone Encounter (Signed)
 New Message:     Patient says she had a Monitor put on last week. She have been itching and the area underneath the Monitor.

## 2024-02-26 NOTE — Telephone Encounter (Signed)
 Called the patient and instructed her to contact Zio for further guidance regarding her heart monitor. The phone number for Zio was given to the patient. Patient verbalized understanding and had no further questions at this time.

## 2024-02-27 NOTE — Telephone Encounter (Signed)
 Called the patient to inform her of Dr. Madireddy's recommendation below:  I agree. Okay to send the revised back. Will review the results once available. Apply moisturizer to the area and if any significant worsening of the skin lesions should have it reviewed with PCP. Thank you  Patient stated that she had her husband look at the heart monitor before she took it off to send back to [employer] and he did not see the redness that  she had called about earlier. She decided to keep the heart monitor on and try to wear it for the remaining time she was to wear it. She also reported that the itching has almost stopped. Patient states that she will turn the heart monitor in at the end of the week. Patient had no further questions at this time.

## 2024-03-09 DIAGNOSIS — R55 Syncope and collapse: Secondary | ICD-10-CM | POA: Diagnosis not present

## 2024-05-01 ENCOUNTER — Other Ambulatory Visit: Payer: Self-pay

## 2024-05-01 DIAGNOSIS — I10 Essential (primary) hypertension: Secondary | ICD-10-CM | POA: Insufficient documentation

## 2024-05-01 DIAGNOSIS — E0500 Thyrotoxicosis with diffuse goiter without thyrotoxic crisis or storm: Secondary | ICD-10-CM | POA: Insufficient documentation

## 2024-05-05 ENCOUNTER — Ambulatory Visit

## 2024-05-05 VITALS — BP 136/84 | HR 90 | Ht 62.0 in | Wt 149.0 lb

## 2024-05-05 DIAGNOSIS — Z01810 Encounter for preprocedural cardiovascular examination: Secondary | ICD-10-CM | POA: Diagnosis not present

## 2024-05-05 DIAGNOSIS — E7800 Pure hypercholesterolemia, unspecified: Secondary | ICD-10-CM | POA: Diagnosis not present

## 2024-05-05 DIAGNOSIS — R55 Syncope and collapse: Secondary | ICD-10-CM

## 2024-05-05 DIAGNOSIS — I10 Essential (primary) hypertension: Secondary | ICD-10-CM

## 2024-05-05 NOTE — Assessment & Plan Note (Signed)
 Lipid-lowering therapy Crestor 5 mg once daily initiated in June by PCP. Last lipid panel from April 2025 previously reviewed with LDL 137, HDL 72.  Continue with dietary modifications to target lipid lowering along with Crestor 5 mg once daily. Continue to follow-up with PCP. Target LDL below 100 mg/dL.

## 2024-05-05 NOTE — Patient Instructions (Signed)
 Medication Instructions:  Your physician recommends that you continue on your current medications as directed. Please refer to the Current Medication list given to you today.  *If you need a refill on your cardiac medications before your next appointment, please call your pharmacy*  Lab Work: None If you have labs (blood work) drawn today and your tests are completely normal, you will receive your results only by: MyChart Message (if you have MyChart) OR A paper copy in the mail If you have any lab test that is abnormal or we need to change your treatment, we will call you to review the results.  Testing/Procedures: None  Follow-Up: At [facility], you and your health needs are our priority.  As part of our continuing mission to provide you with exceptional heart care, our providers are all part of one team.  This team includes your primary Cardiologist (physician) and Advanced Practice Providers or APPs (Physician Assistants and Nurse Practitioners) who all work together to provide you with the care you need, when you need it.  Your next appointment:   Follow up as needed   Provider:   Alean Kobus, MD    We recommend signing up for the patient portal called MyChart.  Sign up information is provided on this After Visit Summary.  MyChart is used to connect with patients for Virtual Visits (Telemedicine).  Patients are able to view lab/test results, encounter notes, upcoming appointments, etc.  Non-urgent messages can be sent to your provider as well.   To learn more about what you can do with MyChart, go to [email or web address].   Other Instructions Please call the office and let us  know if you are general or spinal anesthesia  Please keep a BP log for 2 weeks and send by MyChart or mail.                          Name and DOB__________________________ Dr. Madireddy [address] [place], [place] [address]  Blood Pressure Record Sheet To take your blood pressure,  you will need a blood pressure machine. You can buy a blood pressure machine (blood pressure monitor) at your clinic, drug store, or online. When choosing one, consider: An automatic monitor that has an arm cuff. A cuff that wraps snugly around your upper arm. You should be able to fit only one finger between your arm and the cuff. A device that stores blood pressure reading results. Do not choose a monitor that measures your blood pressure from your wrist or finger. Follow your health care provider's instructions for how to take your blood pressure. To use this form: Get one reading in the morning (a.m.) 1-2 hours after you take any medicines. Get one reading in the evening (p.m.) before supper.   Blood pressure log Date: _______________________  a.m. _____________________(1st reading) HR___________            p.m. _____________________(2nd reading) HR__________  Date: _______________________  a.m. _____________________(1st reading) HR___________            p.m. _____________________(2nd reading) HR__________  Date: _______________________  a.m. _____________________(1st reading) HR___________            p.m. _____________________(2nd reading) HR__________  Date: _______________________  a.m. _____________________(1st reading) HR___________            p.m. _____________________(2nd reading) HR__________  Date: _______________________  a.m. _____________________(1st reading) HR___________            p.m. _____________________(2nd reading) HR__________  Date: _______________________  a.m. _____________________(1st reading) HR___________            p.m. _____________________(2nd reading) HR__________  Date: _______________________  a.m. _____________________(1st reading) HR___________            p.m. _____________________(2nd reading) HR__________   This information is not intended to replace advice given to you by your health care provider. Make sure you discuss  any questions you have with your health care provider. Document Revised: 12/09/2019 Document Reviewed: 12/09/2019 Elsevier Patient Education  2021 [employer].

## 2024-05-05 NOTE — Progress Notes (Signed)
 Cardiology Consultation:    Date:  05/05/2024   ID:  Jillian Farley, DOB 30-Jul-1951, MRN [id number]  PCP:  Verena Mems, MD  Cardiologist:  Alean SAUNDERS Eragon Hammond, MD   Referring MD: Verena Mems, MD   No chief complaint on file.    ASSESSMENT AND PLAN:   Jillian Farley 73 year old woman history of prediabetes, hypertension, hyperlipidemia, was evaluated for near syncopal episodes which appeared vasovagal in description and heart murmur observed on physical examination.  Limited mobility due to bilateral knee osteoarthritis.   Unremarkable echocardiogram and Zio patch monitor results from June 2025. She is wondering about preop cardiovascular risk assessment prior to knee surgery that is anticipated in the near future.  Problem List Items Addressed This Visit     Hypercholesteremia   Lipid-lowering therapy Crestor 5 mg once daily initiated in June by PCP. Last lipid panel from April 2025 previously reviewed with LDL 137, HDL 72.  Continue with dietary modifications to target lipid lowering along with Crestor 5 mg once daily. Continue to follow-up with PCP. Target LDL below 100 mg/dL.      Relevant Medications   rosuvastatin (CRESTOR) 5 MG tablet   Essential hypertension - Primary   Suboptimal today. Has been well-controlled at last office visit. Advised her to continue logging blood pressure readings at home and target less than 130/80 mmHg. Continue valsartan -hydrochlorothiazide 160 mg - 25 mg once daily. If uncontrolled, dose can be further titrated up.  Advised her to follow-up either with us  or PCP in this regard and recommended to keep a home blood pressure log.      Relevant Medications   rosuvastatin (CRESTOR) 5 MG tablet   Near syncope   No further episodes. Reassuring echocardiogram and heart monitor findings.      Relevant Medications   rosuvastatin (CRESTOR) 5 MG tablet   Preop cardiovascular exam   From cardiac standpoint no active symptoms however  functional status is limited unable to assess if she is able to do more than 4 METS activity.  No other significant cardiac structural and functional abnormalities. If the surgery for the knee replacement is going to be under spinal anesthesia and avoiding general anesthesia, okay to proceed with surgery as being planned.  If plan is to proceed with surgery under general anesthesia, would recommend further restratification with Lexiscan stress test nuclear imaging to rule out any high risk findings.  She will review with her surgeon at her upcoming appointment and will let us  know.      Return to clinic for follow-up with us  on an as-needed basis.   History of Present Illness:    Jillian Farley is a 73 y.o. female who is being seen today for follow-up visit. PCP is Verena Mems, MD.  Last visit with me in the office was 02/19/2024.  Has history of prediabetes, hypertension, hyperlipidemia, was evaluated for near syncopal episodes which appeared vasovagal in description and heart murmur observed on physical examination.  Limited mobility due to bilateral knee osteoarthritis.  Lives with her husband.  Retired last September.  Here for the visit today accompanied by her husband.  14-day heart monitor from 02/19/2024 noted predominantly sinus rhythm with average heart rate 71/min [ranging from 44 to 127 bpm].  Rare ventricular and supraventricular ectopy burden, less than 1%.  23 runs of SVT recorded with the longest episode lasting 12 beats, asymptomatic.  Total 9 patient triggered events and diary entries reported correlated with normal heart rate and rhythm and at times  with isolated supraventricular ectopic beats.  No high-grade AV blocks, pauses or sustained arrhythmias.  Transthoracic echocardiogram 02/24/2024 noted normal biventricular function with LVEF 65 to 70%, grade 1 diastolic dysfunction, trace MR, mild TR.  No significant change in comparison to prior echocardiogram fromApril  2022.  Overall mentions she has been doing well.  Denies any symptoms of chest pain or shortness of breath at rest. Denies any: Near syncopal episodes. Does report occasional sense of palpitations when she drinks more than 1 cup of coffee, has been typically drinking decaffeinated. Does have mild congestion symptoms in her sinuses and has been using Allegra.  Mentions mobility due to knee joint pain has been chronic and limiting her from walking.  Mentions ambulating has been difficult for the past couple years.  Pending follow-up visit with orthopedics and anticipating joint replacements in the near future.  Has started taking Crestor 5 mg once daily since June 25, tolerating well, being managed by PCP.  Has blood pressure cuff at home but has not been recently checking.  Routinely had been well-controlled since PCP titrated up the dose of hydrochlorothiazide-valsartan .   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 Swallow whole.   Cholecalciferol (QC VITAMIN D3) 50 MCG (2000 UT) CAPS 1 per day   MELOXICAM PO Take 15 mg by mouth as needed (pain).   metFORMIN (GLUCOPHAGE) 500 MG tablet Take 500 mg by mouth daily.   OVER THE COUNTER MEDICATION Apply 1 Application topically daily. Blue- Emu   rosuvastatin (CRESTOR) 5 MG tablet Take 5 mg by mouth daily.   valsartan -hydrochlorothiazide (DIOVAN -HCT) 160-25 MG tablet Take 1 tablet by mouth daily.      Allergies:   Patient has no known allergies.   Social History   Socioeconomic History   Marital status: Married    Spouse name: Not on file   Number of children: Not on file   Years of education: Not on file   Highest education level: Not on file  Occupational History   Not on file  Tobacco Use   Smoking status: Former    Types: Cigarettes   Smokeless tobacco: Never  Substance and Sexual Activity   Alcohol use: Not Currently   Drug use: Never   Sexual activity: Yes  Other Topics Concern   Not on file  Social History Narrative   Not on file   Social Drivers of Health   Financial Resource Strain: Not on file  Food Insecurity: Not on file  Transportation Needs: Not on file  Physical Activity: Not on file  Stress: Not on file (10/13/2019)  Social Connections: Not on file     Family History: The patient's family history includes Angina in her father; Cancer in her mother; Dementia in her mother; Healthy in her paternal grandmother. ROS:   Please see the history of present illness.    All 14 point review of systems negative except as described per history of  present illness.  EKGs/Labs/Other Studies Reviewed:    The following studies were reviewed today:   EKG:       Recent Labs: 02/02/2024: ALT 15; BUN 13; Creatinine, Ser 0.99; Hemoglobin 13.0; Platelets 449; Potassium 3.6; Sodium 137  Recent Lipid Panel No results found for: CHOL, TRIG, HDL, CHOLHDL, VLDL, LDLCALC, LDLDIRECT  Physical Exam:    VS:  BP (!) 146/88   Pulse 90   Ht 5' 2 (1.575 m)   Wt 149 lb 0.6 oz (67.6 kg)   SpO2 97%   BMI 27.26 kg/m     Wt Readings from Last 3 Encounters:  05/05/24 149 lb 0.6 oz (67.6 kg)  02/19/24 143 lb 9.6 oz (65.1 kg)  02/02/24 139 lb (63 kg)     GENERAL:  Well nourished, well developed in no acute distress CARDIAC: RRR, S1 and S2 present, no murmurs, no rubs, no gallops CHEST:  Clear to auscultation without rales, wheezing or rhonchi   Extremities: No pitting pedal edema. Pulses bilaterally symmetric with radial 2+ and dorsalis pedis 2+ NEUROLOGIC:  Alert and oriented x 3  Medication Adjustments/Labs and Tests Ordered: Current medicines are reviewed at length with the patient today.  Concerns regarding medicines are outlined above.  No orders of the defined types were placed in this encounter.  No orders of the defined types were placed in this encounter.   Signed, Alean jess Kobus, MD, MPH, [facility]. 05/05/2024 9:26 AM    [facility]

## 2024-05-05 NOTE — Assessment & Plan Note (Addendum)
 From cardiac standpoint no active symptoms however functional status is limited unable to assess if she is able to do more than 4 METS activity.  No other significant cardiac structural and functional abnormalities. If the surgery for the knee replacement is going to be under spinal anesthesia and avoiding general anesthesia, okay to proceed with surgery as being planned.  If plan is to proceed with surgery under general anesthesia, would recommend further restratification with Lexiscan stress test nuclear imaging to rule out any high risk findings.  She will review with her surgeon at her upcoming appointment and will let us  know.

## 2024-05-05 NOTE — Assessment & Plan Note (Signed)
 No further episodes. Reassuring echocardiogram and heart monitor findings.

## 2024-05-05 NOTE — Assessment & Plan Note (Signed)
 Suboptimal today. Has been well-controlled at last office visit. Advised her to continue logging blood pressure readings at home and target less than 130/80 mmHg. Continue valsartan -hydrochlorothiazide 160 mg - 25 mg once daily. If uncontrolled, dose can be further titrated up.  Advised her to follow-up either with us  or PCP in this regard and recommended to keep a home blood pressure log.

## 2024-06-05 ENCOUNTER — Telehealth: Payer: Self-pay

## 2024-06-05 NOTE — Telephone Encounter (Signed)
   Patient Name: Jillian Farley  DOB: May 24, 1951 MRN: [id number]  Primary Cardiologist: None  Chart reviewed as part of pre-operative protocol coverage. Given past medical history and time since last visit, based on ACC/AHA guidelines, Jillian Farley is at acceptable risk for the planned procedure without further cardiovascular testing.   Per Dr. Liborio 05/05/2024 Preop cardiovascular exam     From cardiac standpoint no active symptoms however functional status is limited unable to assess if she is able to do more than 4 METS activity.   No other significant cardiac structural and functional abnormalities. If the surgery for the knee replacement is going to be under spinal anesthesia and avoiding general anesthesia, okay to proceed with surgery as being planned.      The patient was advised that if she develops new symptoms prior to surgery to contact our office to arrange for a follow-up visit, and she verbalized understanding.  I will route this recommendation to the requesting party via Epic fax function and remove from pre-op pool.  Please call with questions.  Lamarr Satterfield, NP 06/05/2024, 7:36 AM

## 2024-06-05 NOTE — Telephone Encounter (Signed)
   Pre-operative Risk Assessment    Patient Name: Jillian Farley  DOB: [birth year] MRN: [id number]   Date of last office visit: 05/05/24 Date of next office visit: N/A   Request for Surgical Clearance    Procedure:  right total knee arthroplasty  Date of Surgery:  Clearance TBD                                Surgeon:  Dr. Norleen Jama Gavel Surgeon's Group or Practice Name:  [facility] Phone number:  [phone] Fax number:  [phone]   Type of Clearance Requested:   - Medical    Type of Anesthesia:  Spinal   Additional requests/questions:    SignedAnnabella LITTIE Sayres   06/05/2024, 6:55 AM

## 2024-06-30 ENCOUNTER — Telehealth: Payer: Self-pay

## 2024-06-30 DIAGNOSIS — Z01810 Encounter for preprocedural cardiovascular examination: Secondary | ICD-10-CM

## 2024-06-30 NOTE — Telephone Encounter (Signed)
 Recommendations reviewed with pt as per Dr. Madireddy's note.  Pt verbalized understanding and had no additional questions.  Instructions for lexiscan reviewed and pt verbalized understanding . Sent instructions via MyChart as well.

## 2024-06-30 NOTE — Telephone Encounter (Signed)
 Caller Mateo) stated Dr. Yvone wants a call back directly from Dr. Liborio as soon as possible regarding patient's surgery tomorrow (10/28).  Cell phone number for Dr. Yvone is [phone].

## 2024-07-03 ENCOUNTER — Ambulatory Visit: Payer: Self-pay

## 2024-07-03 ENCOUNTER — Ambulatory Visit (HOSPITAL_COMMUNITY)
Admission: RE | Admit: 2024-07-03 | Discharge: 2024-07-03 | Disposition: A | Discharge status: Home / Self Care | Source: Ambulatory Visit

## 2024-07-03 DIAGNOSIS — Z01810 Encounter for preprocedural cardiovascular examination: Secondary | ICD-10-CM | POA: Insufficient documentation

## 2024-07-03 LAB — MYOCARDIAL PERFUSION IMAGING
LV dias vol: 59 mL (ref 46–106)
LV sys vol: 13 mL (ref 3.8–5.2)
Nuc Stress EF: 78 %
Peak HR: 111 {beats}/min
Rest HR: 75 {beats}/min
Rest Nuclear Isotope Dose: 10.1 mCi
SDS: 0
SRS: 2
SSS: 0
ST Depression (mm): 0 mm
Stress Nuclear Isotope Dose: 30.1 mCi
TID: 1

## 2024-07-03 MED ORDER — REGADENOSON 0.4 MG/5ML IV SOLN
INTRAVENOUS | Status: AC
  Filled 2024-07-03: qty 5

## 2024-07-03 MED ORDER — TECHNETIUM TC 99M TETROFOSMIN IV KIT
10.1000 | PACK | Freq: Once | INTRAVENOUS | Status: AC | PRN
  Administered 2024-07-03: 10.1 via INTRAVENOUS

## 2024-07-03 MED ORDER — REGADENOSON 0.4 MG/5ML IV SOLN
0.4000 mg | Freq: Once | INTRAVENOUS | Status: AC
  Administered 2024-07-03: 0.4 mg via INTRAVENOUS

## 2024-07-03 MED ORDER — TECHNETIUM TC 99M TETROFOSMIN IV KIT
30.1000 | PACK | Freq: Once | INTRAVENOUS | Status: AC | PRN
  Administered 2024-07-03: 30.1 via INTRAVENOUS
